# Patient Record
Sex: Female | Born: 1975 | Race: Black or African American | Hispanic: No | Marital: Single | Smoking: Former smoker
Health system: Southern US, Community
[De-identification: ages and names within clinical notes are randomized; demographics above are authoritative.]

## PROBLEM LIST (undated history)

## (undated) DIAGNOSIS — K219 Gastro-esophageal reflux disease without esophagitis: Secondary | ICD-10-CM

## (undated) DIAGNOSIS — I10 Essential (primary) hypertension: Secondary | ICD-10-CM

## (undated) DIAGNOSIS — E119 Type 2 diabetes mellitus without complications: Secondary | ICD-10-CM

## (undated) DIAGNOSIS — N838 Other noninflammatory disorders of ovary, fallopian tube and broad ligament: Secondary | ICD-10-CM

## (undated) DIAGNOSIS — K5790 Diverticulosis of intestine, part unspecified, without perforation or abscess without bleeding: Secondary | ICD-10-CM

## (undated) HISTORY — PX: CHOLECYSTECTOMY: SHX55

---

## 2006-01-12 ENCOUNTER — Emergency Department (HOSPITAL_COMMUNITY): Admission: EM | Admit: 2006-01-12 | Discharge: 2006-01-12 | Payer: Self-pay | Admitting: Emergency Medicine

## 2006-10-21 ENCOUNTER — Emergency Department (HOSPITAL_COMMUNITY): Admission: EM | Admit: 2006-10-21 | Discharge: 2006-10-21 | Payer: Self-pay | Admitting: Emergency Medicine

## 2008-03-22 ENCOUNTER — Emergency Department (HOSPITAL_COMMUNITY): Admission: EM | Admit: 2008-03-22 | Discharge: 2008-03-23 | Payer: Self-pay | Admitting: Emergency Medicine

## 2009-01-11 ENCOUNTER — Emergency Department (HOSPITAL_COMMUNITY): Admission: EM | Admit: 2009-01-11 | Discharge: 2009-01-11 | Payer: Self-pay | Admitting: Emergency Medicine

## 2009-07-02 ENCOUNTER — Emergency Department (HOSPITAL_COMMUNITY): Admission: EM | Admit: 2009-07-02 | Discharge: 2009-07-02 | Payer: Self-pay | Admitting: Family Medicine

## 2009-08-08 ENCOUNTER — Emergency Department (HOSPITAL_COMMUNITY): Admission: EM | Admit: 2009-08-08 | Discharge: 2009-08-08 | Payer: Self-pay | Admitting: Emergency Medicine

## 2010-01-07 ENCOUNTER — Observation Stay (HOSPITAL_COMMUNITY)
Admission: EM | Admit: 2010-01-07 | Discharge: 2010-01-09 | Payer: Self-pay | Source: Home / Self Care | Admitting: Emergency Medicine

## 2010-01-08 ENCOUNTER — Encounter (INDEPENDENT_AMBULATORY_CARE_PROVIDER_SITE_OTHER): Payer: Self-pay

## 2010-05-20 LAB — PREGNANCY, URINE: Preg Test, Ur: NEGATIVE

## 2010-05-20 LAB — CBC
HCT: 34.5 % — ABNORMAL LOW (ref 36.0–46.0)
MCH: 27.9 pg (ref 26.0–34.0)
MCV: 87.6 fL (ref 78.0–100.0)
Platelets: 283 10*3/uL (ref 150–400)
RBC: 3.52 MIL/uL — ABNORMAL LOW (ref 3.87–5.11)
RDW: 15 % (ref 11.5–15.5)
WBC: 13.1 10*3/uL — ABNORMAL HIGH (ref 4.0–10.5)
WBC: 18.5 10*3/uL — ABNORMAL HIGH (ref 4.0–10.5)

## 2010-05-20 LAB — URINALYSIS, ROUTINE W REFLEX MICROSCOPIC
Bilirubin Urine: NEGATIVE
Glucose, UA: NEGATIVE mg/dL
Ketones, ur: NEGATIVE mg/dL
Protein, ur: NEGATIVE mg/dL
Urobilinogen, UA: 0.2 mg/dL (ref 0.0–1.0)

## 2010-05-20 LAB — DIFFERENTIAL
Basophils Absolute: 0 10*3/uL (ref 0.0–0.1)
Basophils Relative: 0 % (ref 0–1)
Neutro Abs: 15.3 10*3/uL — ABNORMAL HIGH (ref 1.7–7.7)
Neutrophils Relative %: 83 % — ABNORMAL HIGH (ref 43–77)

## 2010-05-20 LAB — COMPREHENSIVE METABOLIC PANEL
AST: 34 U/L (ref 0–37)
Albumin: 2.9 g/dL — ABNORMAL LOW (ref 3.5–5.2)
Alkaline Phosphatase: 74 U/L (ref 39–117)
Alkaline Phosphatase: 81 U/L (ref 39–117)
BUN: 9 mg/dL (ref 6–23)
Chloride: 106 mEq/L (ref 96–112)
Creatinine, Ser: 0.87 mg/dL (ref 0.4–1.2)
GFR calc Af Amer: >60 mL/min (ref >60)
Glucose, Bld: 164 mg/dL — ABNORMAL HIGH (ref 70–99)
Potassium: 3.3 mEq/L — ABNORMAL LOW (ref 3.5–5.1)
Potassium: 3.6 mEq/L (ref 3.5–5.1)
Sodium: 139 mEq/L (ref 135–145)
Total Bilirubin: 0.7 mg/dL (ref 0.3–1.2)
Total Bilirubin: 0.8 mg/dL (ref 0.3–1.2)
Total Protein: 6.9 g/dL (ref 6.0–8.3)

## 2010-05-20 LAB — URINE CULTURE: Culture  Setup Time: 201111010520

## 2010-05-20 LAB — URINE MICROSCOPIC-ADD ON

## 2010-05-26 LAB — URINE MICROSCOPIC-ADD ON

## 2010-05-26 LAB — URINALYSIS, ROUTINE W REFLEX MICROSCOPIC
Bilirubin Urine: NEGATIVE
Glucose, UA: NEGATIVE mg/dL
Hgb urine dipstick: NEGATIVE
Specific Gravity, Urine: 1.03 (ref 1.005–1.030)

## 2010-05-26 LAB — CBC
Hemoglobin: 11.2 g/dL — ABNORMAL LOW (ref 12.0–15.0)
MCHC: 33.1 g/dL (ref 30.0–36.0)
MCV: 86.5 fL (ref 78.0–100.0)
RBC: 3.91 MIL/uL (ref 3.87–5.11)
WBC: 9.9 10*3/uL (ref 4.0–10.5)

## 2010-05-26 LAB — DIFFERENTIAL
Eosinophils Absolute: 0.2 10*3/uL (ref 0.0–0.7)
Lymphs Abs: 2.9 10*3/uL (ref 0.7–4.0)
Monocytes Absolute: 0.6 10*3/uL (ref 0.1–1.0)
Monocytes Relative: 6 % (ref 3–12)
Neutro Abs: 6 10*3/uL (ref 1.7–7.7)
Neutrophils Relative %: 61 % (ref 43–77)

## 2010-05-26 LAB — BASIC METABOLIC PANEL
BUN: 12 mg/dL (ref 6–23)
Creatinine, Ser: 0.94 mg/dL (ref 0.4–1.2)
Glucose, Bld: 165 mg/dL — ABNORMAL HIGH (ref 70–99)
Potassium: 3.4 mEq/L — ABNORMAL LOW (ref 3.5–5.1)

## 2010-05-26 LAB — POCT PREGNANCY, URINE: Preg Test, Ur: NEGATIVE

## 2010-05-26 LAB — WET PREP, GENITAL

## 2010-05-26 LAB — GC/CHLAMYDIA PROBE AMP, GENITAL: GC Probe Amp, Genital: NEGATIVE

## 2010-05-27 LAB — URINALYSIS, MICROSCOPIC ONLY
Glucose, UA: NEGATIVE mg/dL
Hgb urine dipstick: NEGATIVE
Specific Gravity, Urine: 1.029 (ref 1.005–1.030)

## 2010-05-27 LAB — URINE CULTURE: Colony Count: 60000

## 2010-05-27 LAB — COMPREHENSIVE METABOLIC PANEL
Albumin: 3.2 g/dL — ABNORMAL LOW (ref 3.5–5.2)
Alkaline Phosphatase: 78 U/L (ref 39–117)
BUN: 9 mg/dL (ref 6–23)
Calcium: 8.8 mg/dL (ref 8.4–10.5)
Glucose, Bld: 121 mg/dL — ABNORMAL HIGH (ref 70–99)
Potassium: 4 mEq/L (ref 3.5–5.1)
Sodium: 138 mEq/L (ref 135–145)
Total Protein: 6.8 g/dL (ref 6.0–8.3)

## 2010-05-27 LAB — CBC
HCT: 32.9 % — ABNORMAL LOW (ref 36.0–46.0)
Hemoglobin: 11.3 g/dL — ABNORMAL LOW (ref 12.0–15.0)
MCHC: 34.4 g/dL (ref 30.0–36.0)
Platelets: 283 10*3/uL (ref 150–400)
RDW: 15.1 % (ref 11.5–15.5)

## 2010-05-27 LAB — POCT URINALYSIS DIP (DEVICE)
Bilirubin Urine: NEGATIVE
Glucose, UA: NEGATIVE mg/dL
Ketones, ur: NEGATIVE mg/dL
Specific Gravity, Urine: >=1.03 (ref 1.005–1.030)
Urobilinogen, UA: 0.2 mg/dL (ref 0.0–1.0)

## 2010-05-27 LAB — POCT PREGNANCY, URINE: Preg Test, Ur: NEGATIVE

## 2010-06-23 LAB — URINE MICROSCOPIC-ADD ON

## 2010-06-23 LAB — URINALYSIS, ROUTINE W REFLEX MICROSCOPIC
Glucose, UA: NEGATIVE mg/dL
Nitrite: NEGATIVE
Specific Gravity, Urine: 1.026 (ref 1.005–1.030)
pH: 6 (ref 5.0–8.0)

## 2010-06-23 LAB — POCT PREGNANCY, URINE: Preg Test, Ur: NEGATIVE

## 2010-11-25 ENCOUNTER — Emergency Department (HOSPITAL_COMMUNITY)
Admission: EM | Admit: 2010-11-25 | Discharge: 2010-11-26 | Disposition: A | Discharge status: Home / Self Care | Payer: Medicaid Other | Attending: Emergency Medicine | Admitting: Emergency Medicine

## 2010-11-25 DIAGNOSIS — E669 Obesity, unspecified: Secondary | ICD-10-CM | POA: Insufficient documentation

## 2010-11-25 DIAGNOSIS — R109 Unspecified abdominal pain: Secondary | ICD-10-CM | POA: Insufficient documentation

## 2010-11-25 DIAGNOSIS — N83209 Unspecified ovarian cyst, unspecified side: Secondary | ICD-10-CM | POA: Insufficient documentation

## 2010-11-25 DIAGNOSIS — A5901 Trichomonal vulvovaginitis: Secondary | ICD-10-CM | POA: Insufficient documentation

## 2010-11-26 ENCOUNTER — Emergency Department (HOSPITAL_COMMUNITY): Payer: Medicaid Other

## 2010-11-26 LAB — URINE MICROSCOPIC-ADD ON

## 2010-11-26 LAB — URINALYSIS, ROUTINE W REFLEX MICROSCOPIC
Glucose, UA: NEGATIVE mg/dL
Nitrite: NEGATIVE
Specific Gravity, Urine: 1.035 — ABNORMAL HIGH (ref 1.005–1.030)
pH: 5 (ref 5.0–8.0)

## 2010-11-26 LAB — GC/CHLAMYDIA PROBE AMP, GENITAL: GC Probe Amp, Genital: NEGATIVE

## 2010-11-26 LAB — WET PREP, GENITAL

## 2010-11-26 LAB — PREGNANCY, URINE: Preg Test, Ur: NEGATIVE

## 2010-12-19 ENCOUNTER — Other Ambulatory Visit (HOSPITAL_COMMUNITY): Payer: Self-pay

## 2012-07-08 ENCOUNTER — Encounter (HOSPITAL_COMMUNITY): Payer: Self-pay | Admitting: *Deleted

## 2012-07-08 ENCOUNTER — Emergency Department (HOSPITAL_COMMUNITY)
Admission: EM | Admit: 2012-07-08 | Discharge: 2012-07-08 | Disposition: A | Discharge status: Home / Self Care | Payer: Medicaid Other | Attending: Emergency Medicine | Admitting: Emergency Medicine

## 2012-07-08 ENCOUNTER — Emergency Department (HOSPITAL_COMMUNITY): Payer: Medicaid Other

## 2012-07-08 DIAGNOSIS — M7989 Other specified soft tissue disorders: Secondary | ICD-10-CM

## 2012-07-08 DIAGNOSIS — M79609 Pain in unspecified limb: Secondary | ICD-10-CM

## 2012-07-08 DIAGNOSIS — M1710 Unilateral primary osteoarthritis, unspecified knee: Secondary | ICD-10-CM

## 2012-07-08 DIAGNOSIS — X500XXA Overexertion from strenuous movement or load, initial encounter: Secondary | ICD-10-CM | POA: Insufficient documentation

## 2012-07-08 DIAGNOSIS — S8990XA Unspecified injury of unspecified lower leg, initial encounter: Secondary | ICD-10-CM | POA: Insufficient documentation

## 2012-07-08 DIAGNOSIS — M25562 Pain in left knee: Secondary | ICD-10-CM

## 2012-07-08 DIAGNOSIS — Y929 Unspecified place or not applicable: Secondary | ICD-10-CM | POA: Insufficient documentation

## 2012-07-08 DIAGNOSIS — IMO0002 Reserved for concepts with insufficient information to code with codable children: Secondary | ICD-10-CM | POA: Insufficient documentation

## 2012-07-08 DIAGNOSIS — Y9301 Activity, walking, marching and hiking: Secondary | ICD-10-CM | POA: Insufficient documentation

## 2012-07-08 MED ORDER — OXYCODONE-ACETAMINOPHEN 5-325 MG PO TABS: 1.0000 | ORAL_TABLET | ORAL | Status: DC | PRN

## 2012-07-08 MED ORDER — ONDANSETRON 4 MG PO TBDP
4.0000 mg | ORAL_TABLET | Freq: Once | ORAL | Status: AC
  Administered 2012-07-08: 4 mg via ORAL
  Filled 2012-07-08: qty 1

## 2012-07-08 MED ORDER — OXYCODONE-ACETAMINOPHEN 5-325 MG PO TABS
2.0000 | ORAL_TABLET | Freq: Once | ORAL | Status: AC
  Administered 2012-07-08: 2 via ORAL
  Filled 2012-07-08: qty 2

## 2012-07-08 NOTE — Progress Notes (Signed)
Orthopedic Tech Progress Note Patient Details:  Erika Sherman 30-May-1975 [id number] Knee Immobilizer applied to Left LE. Patient is larger so knee immobilizer was just able to fit around upper thigh. Application tolerated well. Patient was asked if immobilizer was too tight or causing further pain and she stated it was not.  Ortho Devices Type of Ortho Device: Knee Immobilizer Ortho Device/Splint Interventions: Application   Asia R Thompson 07/08/2012, 12:42 PM

## 2012-07-08 NOTE — ED Notes (Signed)
Patient states leg injury some time ago  but yesterday she felt like knee cap shifted and now states knee with swelling and increase in pain

## 2012-07-08 NOTE — ED Provider Notes (Signed)
Medical screening examination/treatment/procedure(s) were performed by non-physician practitioner and as supervising physician I was immediately available for consultation/collaboration  Harrold Donath R. Rubin Payor, MD 07/08/12 [phone]

## 2012-07-08 NOTE — Progress Notes (Signed)
VASCULAR LAB PRELIMINARY  PRELIMINARY  PRELIMINARY  PRELIMINARY  Left lower extremity venous duplex completed.    Preliminary report:  Left:  No evidence of DVT, superficial thrombosis, or Baker's cyst.  Gizelle Whetsel, RVS 07/08/2012, 12:08 PM

## 2012-07-08 NOTE — ED Provider Notes (Signed)
History     CSN: [id number]  Arrival date & time 07/08/12  0746   First MD Initiated Contact with Patient 07/08/12 [phone]      Chief Complaint  Patient presents with  . Leg Pain    (Consider location/radiation/quality/duration/timing/severity/associated sxs/prior treatment) HPI  37 year old super morbidly obese female sent to the emergency department chief complaint of left knee pain.  Patient states that approximately 2 months ago she developed some aching pain in the knee that went away.  2 days ago the patient spent time walking a lot more than she normally does.  Patient states that she was laying in her bed and she moved "some kind of way" felt a pop in the anterior medial side of her knee and had immediate severe pain.  Patient states she is "barely able to walk."  Although she can weight bear weight toe touching.  Patient has pain at Ray at rest.  It is worse with palpation and movement.  She has had some swelling.  She denies a history of DVT or pulmonary embolism.  She denies she is on any exogenous estrogens.  She denies any heat in the leg.  Denies fevers, chills, myalgias, arthralgias. Denies DOE, SOB, chest tightness or pressure, radiation to left arm, jaw or back, or diaphoresis. Denies dysuria, flank pain, suprapubic pain, frequency, urgency, or hematuria. Denies headaches, light headedness, weakness, visual disturbances. Denies abdominal pain, nausea, vomiting, diarrhea or constipation.    History reviewed. No pertinent past medical history.  Past Surgical History  Procedure Laterality Date  . Cholecystectomy    . Cesarean section      No family history on file.  History  Substance Use Topics  . Smoking status: Not on file  . Smokeless tobacco: Not on file  . Alcohol Use: Not on file    OB History   Grav Para Term Preterm Abortions TAB SAB Ect Mult Living                  Review of Systems Ten systems reviewed and are negative for acute change, except as  noted in the HPI.   Allergies  Review of patient's allergies indicates no known allergies.  Home Medications   Current Outpatient Rx  Name  Route  Sig  Dispense  Refill  . acetaminophen (TYLENOL) 500 MG tablet   Oral   Take 500 mg by mouth every 6 (six) hours as needed for pain.           BP 152/94  Temp(Src) 97.5 F (36.4 C) (Oral)  Resp 20  SpO2 99%  LMP 06/14/2012  Physical Exam  Nursing note and vitals reviewed. Constitutional: She is oriented to person, place, and time. She appears well-developed and well-nourished.  Super morbidly obese female.  She appears to be in pain.  HENT:  Head: Normocephalic and atraumatic.  Eyes: Conjunctivae are normal. No scleral icterus.  Neck: Normal range of motion.  Cardiovascular: Normal rate, regular rhythm and normal heart sounds.  Exam reveals no gallop and no friction rub.   No murmur heard. Pulmonary/Chest: Effort normal and breath sounds normal. No respiratory distress.  Abdominal: Soft. Bowel sounds are normal. She exhibits no distension and no mass. There is no tenderness. There is no guarding.  Musculoskeletal:  A left knee exam was performed. SKIN: intact SWELLING: mild EFFUSION: none WARMTH: no warmth TENDERNESS: moderate ROM: limited by pain, and 90 degrees flexion180 degrees extension NEUROLOGICAL EXAM: normal VASCULAR EXAM: pulses present, dorsal pedalis pulse present,  tibialis posterior pulse present and no engorgement of the vessels or telangiectasias or CALF TENDERNESS: yes 1+ pitting edema in the left ankle.  No pitting edema in the right.   Neurological: She is alert and oriented to person, place, and time.  Skin: Skin is warm and dry.    ED Course  Procedures (including critical care time)  Labs Reviewed - No data to display No results found.   No diagnosis found.    MDM  9:18 AM Filed Vitals:   07/08/12 0756  BP: 152/94  Temp: 97.5 F (36.4 C)  Resp: 20   Super morbidly obese female  with left knee pain.  Mechanism of injury is uncertain.  Patient may have some internal derangement of the knee versus developing DVT.  Wells criteria for DVT is little risk it is very difficult to evaluate the patient due to her size.  She does have pitting edema in the left leg and calf tenderness who will evaluate for DVT.  X-ray of the knee is pending.  Patient has requested pain medication at this time appear   11:42 AM Xray shows advance OA. (viewed by myself). Patient is currently awaitni [place]. She is resting comfortable with her pain medication.   12:20 PM Patient with negative doppler. D/c with pain medication, knee immobilizer and f/u with ortho. Patient X-Ray negative for obvious fracture or dislocation. Pain managed in ED. Pt advised to follow up with orthopedics if symptoms persist for possibility of missed fracture diagnosis. Patient given brace while in ED, conservative therapy recommended and discussed. Patient will be dc home & is agreeable with above plan.   Arthor Captain, PA-C 07/08/12 1256

## 2013-02-20 ENCOUNTER — Institutional Professional Consult (permissible substitution): Payer: Medicaid Other | Admitting: Pulmonary Disease

## 2013-03-30 ENCOUNTER — Institutional Professional Consult (permissible substitution): Payer: Medicaid Other | Admitting: Pulmonary Disease

## 2013-04-28 ENCOUNTER — Institutional Professional Consult (permissible substitution): Payer: Medicaid Other | Admitting: Pulmonary Disease

## 2013-05-09 ENCOUNTER — Emergency Department (HOSPITAL_COMMUNITY)
Admission: EM | Admit: 2013-05-09 | Discharge: 2013-05-10 | Disposition: A | Discharge status: Home / Self Care | Payer: Medicaid Other | Attending: Emergency Medicine | Admitting: Emergency Medicine

## 2013-05-09 ENCOUNTER — Encounter (HOSPITAL_COMMUNITY): Payer: Self-pay | Admitting: Emergency Medicine

## 2013-05-09 ENCOUNTER — Emergency Department (HOSPITAL_COMMUNITY)
Admission: EM | Admit: 2013-05-09 | Discharge: 2013-05-09 | Disposition: A | Discharge status: Home / Self Care | Payer: Medicaid Other | Source: Home / Self Care | Attending: Emergency Medicine | Admitting: Emergency Medicine

## 2013-05-09 ENCOUNTER — Emergency Department (HOSPITAL_COMMUNITY): Payer: Medicaid Other

## 2013-05-09 DIAGNOSIS — R0789 Other chest pain: Secondary | ICD-10-CM

## 2013-05-09 DIAGNOSIS — M549 Dorsalgia, unspecified: Secondary | ICD-10-CM | POA: Insufficient documentation

## 2013-05-09 DIAGNOSIS — J45901 Unspecified asthma with (acute) exacerbation: Secondary | ICD-10-CM | POA: Insufficient documentation

## 2013-05-09 DIAGNOSIS — J690 Pneumonitis due to inhalation of food and vomit: Secondary | ICD-10-CM

## 2013-05-09 DIAGNOSIS — R071 Chest pain on breathing: Secondary | ICD-10-CM | POA: Insufficient documentation

## 2013-05-09 DIAGNOSIS — F17200 Nicotine dependence, unspecified, uncomplicated: Secondary | ICD-10-CM | POA: Insufficient documentation

## 2013-05-09 DIAGNOSIS — Z8719 Personal history of other diseases of the digestive system: Secondary | ICD-10-CM | POA: Insufficient documentation

## 2013-05-09 DIAGNOSIS — Z79899 Other long term (current) drug therapy: Secondary | ICD-10-CM | POA: Insufficient documentation

## 2013-05-09 DIAGNOSIS — R0602 Shortness of breath: Secondary | ICD-10-CM

## 2013-05-09 DIAGNOSIS — J45909 Unspecified asthma, uncomplicated: Secondary | ICD-10-CM

## 2013-05-09 HISTORY — DX: Gastro-esophageal reflux disease without esophagitis: K21.9

## 2013-05-09 LAB — CBC WITH DIFFERENTIAL/PLATELET
BASOS ABS: 0 10*3/uL (ref 0.0–0.1)
BASOS PCT: 0 % (ref 0–1)
EOS ABS: 0.3 10*3/uL (ref 0.0–0.7)
EOS PCT: 3 % (ref 0–5)
HEMATOCRIT: 36.1 % (ref 36.0–46.0)
Hemoglobin: 11.5 g/dL — ABNORMAL LOW (ref 12.0–15.0)
Lymphocytes Relative: 35 % (ref 12–46)
Lymphs Abs: 3.8 10*3/uL (ref 0.7–4.0)
MCH: 28.4 pg (ref 26.0–34.0)
MCHC: 31.9 g/dL (ref 30.0–36.0)
MCV: 89.1 fL (ref 78.0–100.0)
MONO ABS: 0.8 10*3/uL (ref 0.1–1.0)
MONOS PCT: 7 % (ref 3–12)
NEUTROS ABS: 6 10*3/uL (ref 1.7–7.7)
Neutrophils Relative %: 55 % (ref 43–77)
Platelets: 330 10*3/uL (ref 150–400)
RBC: 4.05 MIL/uL (ref 3.87–5.11)
RDW: 14.6 % (ref 11.5–15.5)
WBC: 10.9 10*3/uL — ABNORMAL HIGH (ref 4.0–10.5)

## 2013-05-09 LAB — I-STAT TROPONIN, ED: TROPONIN I, POC: 0 ng/mL (ref 0.00–0.08)

## 2013-05-09 NOTE — ED Notes (Signed)
Pt states yesterday she had a flare up of reflux and almost vomited but held it in.  Pt feels like she may have aspirated.  C/o pain in lungs, sob, and wheezing.   Pt has tried inhaler and one pain pill with no relief.

## 2013-05-09 NOTE — ED Provider Notes (Signed)
  Chief Complaint   Chief Complaint  Patient presents with  . Aspiration    History of Present Illness   Erika Sherman is a 38 year old morbidly obese female. She's had difficulty breathing since last night with coughing, wheezing, and chest pain the left side. She had an attack of acid reflux around 2 AM. She woke up choking and gasping. The breathing difficulties gone ever since then. She also has abdominal pain. She denies any fever, dizziness, or syncope. She's had no nausea or vomiting. The patient is on no medications right now. She's not taking anything for reflux.  Review of Systems   Other than as noted above, the patient denies any of the following symptoms. Systemic:  No fever, chills, sweats, or fatigue. ENT:  No nasal congestion, rhinorrhea, or sore throat. Pulmonary:  No cough, wheezing, sputum production, hemoptysis. Cardiac:  No chest pain, palpitations, rapid heartbeat, dizziness, presyncope or syncope. Skin:  No rash or itching. Ext:  No leg pain or swelling. Psych:  No anxiety or depression.  PMFSH   Past medical history, family history, social history, meds, and allergies were reviewed and updated as needed. The patient denies any history of asthma, lung disease, heart disease, anxiety, or tobacco use.   Physical Examination    Vital signs:  LMP 04/15/2013 Gen:  Alert, oriented, in no distress, skin warm and dry. She is morbidly obese. Eye:  PERRL, lids and conjunctivas normal.  Sclera non-icteric. ENT:  Mucous membranes moist, pharynx clear. Neck:  Supple, no adenopathy or tenderness.  No JVD. Lungs:  She has diffuse expiratory wheezes and rhonchi. Heart:  Regular rhythm.  No gallops, murmers, clicks or rubs. Abdomen:  Soft, nontender, no organomegaly or mass.  Bowel sounds normal.  No pulsatile abdominal mass or bruit. Ext:  No edema.  No calf tenderness and Homann's sign negative.  Pulses full and equal. Skin:  Warm and dry.  No rash.  Assessment    The encounter diagnosis was Aspiration pneumonia.  I am concerned about the possibility of aspiration pneumonia. The patient is morbidly obese, and I'm unable to do an x-ray which equipment we have here.  Plan    The patient was transferred to the ED via private vehicle in stable condition.  Medical Decision Making   38 year old morbidly obese female had and episode of severe reflux last night at 2 a.m.  Ever since then she's had shortness of breath, cough and wheezing.  On exam she has bilateral wheezes.  I am concerned about aspiration.  We cannot do a chest x-ray here due to her size.  We are transferring by private vehicle.        Reuben Likesavid C Tivis Wherry, MD 05/09/13 2105

## 2013-05-09 NOTE — ED Notes (Signed)
Pt states she has been having some SOB since yesterday.  Pt states she had a gerd attach and is worried some fluid may have gone into her lungs.

## 2013-05-09 NOTE — ED Provider Notes (Signed)
CSN: [id number]     Arrival date & time 05/09/13  2146 History  This chart was scribed for non-physician practitioner Lowella DellGray Arling Cerone, PA-C working with Layla MawKristen N Ward, DO by Danella Maiersaroline Early, ED Scribe. This patient was seen in room TR08C/TR08C and the patient's care was started at 10:00 PM.    Chief Complaint  Patient presents with  . Shortness of Breath  . Gastrophageal Reflux   The history is provided by the patient. No language interpreter was used.   HPI Comments: Erika Sherman is a 38 y.o. female with a h/o asthma who presents to the Emergency Department complaining of SOB since last night with wheezing and one episode of vomiting. She states it gets worse when she lays flat. She was sent here from Urgent Care. Her CXR was normal. She has used her inhaler twice today. The inhaler is as needed. She denies diarrhea constipation fever chills leg swelling cough congestion. She is a smoker - she started smoking 5 cigarettes per day last year.  She also reports reflux since last night. She reports non-radiating, constant, dull CP.The CP worsens when she presses on it. She is unable to determine if the CP is related to the SOB or the reflux. She denies BC use. She denies h/o DVT or PE. She denies recent surgery or trauma.   Age > 36 yo: No HR > 100 bpm: No O2 sat on RA < 95%: No Prior hx of venous thromboembolism:No Trauma or surgery in past 4 wks:No Hemoptysis:No Exogenous Estrogen use:No Unilateral Leg swelling: No Pre tests probability for PE < 15%:No       Past Medical History  Diagnosis Date  . GERD (gastroesophageal reflux disease)    Past Surgical History  Procedure Laterality Date  . Cholecystectomy    . Cesarean section     No family history on file. History  Substance Use Topics  . Smoking status: Heavy Tobacco Smoker -- 0.50 packs/day    Types: Cigarettes  . Smokeless tobacco: Not on file  . Alcohol Use: Yes   OB History   Grav Para Term Preterm Abortions TAB SAB  Ect Mult Living                 Review of Systems  Musculoskeletal: Positive for back pain.  All other systems reviewed and are negative.      Allergies  Review of patient's allergies indicates no known allergies.  Home Medications   Current Outpatient Rx  Name  Route  Sig  Dispense  Refill  . acetaminophen (TYLENOL) 500 MG tablet   Oral   Take 500 mg by mouth every 6 (six) hours as needed for mild pain.          Marland Kitchen. albuterol (PROVENTIL HFA;VENTOLIN HFA) 108 (90 BASE) MCG/ACT inhaler   Inhalation   Inhale 2 puffs into the lungs every 2 (two) hours as needed for wheezing or shortness of breath (cough).   1 Inhaler   0   . naproxen (NAPROSYN) 500 MG tablet   Oral   Take 1 tablet (500 mg total) by mouth 2 (two) times daily.   30 tablet   0    BP 185/93  Pulse 103  Temp(Src) 98.1 F (36.7 C) (Oral)  Resp 18  SpO2 98%  LMP 04/15/2013 Physical Exam  Nursing note and vitals reviewed. Constitutional: She is oriented to person, place, and time. She appears well-developed and well-nourished. No distress.  HENT:  Head: Normocephalic and atraumatic.  Eyes: Conjunctivae  are normal.  Neck: No tracheal deviation present.  Cardiovascular: Normal rate and regular rhythm.  Exam reveals no gallop and no friction rub.   No murmur heard. Pulmonary/Chest: Effort normal. No respiratory distress. She has wheezes (diffuse).  Musculoskeletal: Normal range of motion. She exhibits no edema.  Neurological: She is alert and oriented to person, place, and time.  Skin: Skin is warm and dry. She is not diaphoretic.  Psychiatric: She has a normal mood and affect. Her behavior is normal.    ED Course  Procedures (including critical care time) Medications  ipratropium-albuterol (DUONEB) 0.5-2.5 (3) MG/3ML nebulizer solution 3 mL (3 mLs Nebulization Given 05/10/13 0036)    DIAGNOSTIC STUDIES: Oxygen Saturation is 98% on RA, normal by my interpretation.    COORDINATION OF CARE: 11:10  PM- Discussed treatment plan with pt. Pt agrees to plan.    Labs Review Labs Reviewed  BASIC METABOLIC PANEL - Abnormal; Notable for the following:    Glucose, Bld 132 (*)    All other components within normal limits  CBC WITH DIFFERENTIAL - Abnormal; Notable for the following:    WBC 10.9 (*)    Hemoglobin 11.5 (*)    All other components within normal limits  PRO B NATRIURETIC PEPTIDE  I-STAT TROPOININ, ED   Imaging Review Dg Chest 2 View  05/09/2013   CLINICAL DATA:  Shortness of breath.  Gastroesophageal reflux  EXAM: CHEST  2 VIEW  COMPARISON:  01/07/2010  FINDINGS: The interstitial markings appear prominent, but stable from prior. Borderline cardiomegaly. No edema or consolidation. No effusion or pneumothorax.  IMPRESSION: Stable exam.  No acute cardiopulmonary disease.   Electronically Signed   By: Tiburcio Pea M.D.   On: 05/09/2013 22:29     EKG Interpretation None      MDM   Final diagnoses:  Chest wall pain  Shortness of breath  Asthma  Patient afebrile.  Mild tachycardia at 103 on admission, resolved throughout stay.  Leukocytosis trending down from prior studies.  Patient PERC negative BMP WNL Troponin neg, Doubt MI BNP negative, doubt CHF CXR negative, with no evidence of widened mediastinum. Doubt Dissection.  Patient states her breathing improved after breathing tx in ED. Wheezing resolved with breathing tx.  Patient's chest pain reproducible on exam with compression of left chest wall. Suggestive of chest wall pain. Plan to treat patient with NSAIDs an have her follow up with her PCP in 2 days. Discussed results and exam findings with patient. Patient confirms understanding and agrees with plan. Discharged in good condition.   Meds given in ED:  Medications  ipratropium-albuterol (DUONEB) 0.5-2.5 (3) MG/3ML nebulizer solution 3 mL (3 mLs Nebulization Given 05/10/13 0036)    Discharge Medication List as of 05/10/2013  1:30 AM    START taking these  medications   Details  albuterol (PROVENTIL HFA;VENTOLIN HFA) 108 (90 BASE) MCG/ACT inhaler Inhale 2 puffs into the lungs every 2 (two) hours as needed for wheezing or shortness of breath (cough)., Starting 05/10/2013, Until Discontinued, Print    naproxen (NAPROSYN) 500 MG tablet Take 1 tablet (500 mg total) by mouth 2 (two) times daily., Starting 05/10/2013, Until Discontinued, Print           I personally performed the services described in this documentation, which was scribed in my presence. The recorded information has been reviewed and is accurate.   Rudene Anda, PA-C 05/10/13 1344

## 2013-05-09 NOTE — Discharge Instructions (Signed)
We have determined that your problem requires further evaluation in the emergency department.  We will take care of your transport there.  Once at the emergency department, you will be evaluated by a provider and they will order whatever treatment or tests they deem necessary.  We cannot guarantee that they will do any specific test or do any specific treatment.  ° °

## 2013-05-10 LAB — BASIC METABOLIC PANEL
BUN: 11 mg/dL (ref 6–23)
CHLORIDE: 105 meq/L (ref 96–112)
CO2: 25 mEq/L (ref 19–32)
CREATININE: 0.75 mg/dL (ref 0.50–1.10)
Calcium: 9.4 mg/dL (ref 8.4–10.5)
GFR calc non Af Amer: >90 mL/min (ref >90)
GLUCOSE: 132 mg/dL — AB (ref 70–99)
Potassium: 3.9 mEq/L (ref 3.7–5.3)
Sodium: 143 mEq/L (ref 137–147)

## 2013-05-10 LAB — PRO B NATRIURETIC PEPTIDE: Pro B Natriuretic peptide (BNP): <5 pg/mL (ref 0–125)

## 2013-05-10 MED ORDER — IPRATROPIUM-ALBUTEROL 0.5-2.5 (3) MG/3ML IN SOLN
3.0000 mL | Freq: Once | RESPIRATORY_TRACT | Status: AC
  Administered 2013-05-10: 3 mL via RESPIRATORY_TRACT
  Filled 2013-05-10: qty 3

## 2013-05-10 MED ORDER — NAPROXEN 500 MG PO TABS
500.0000 mg | ORAL_TABLET | Freq: Two times a day (BID) | ORAL | Status: DC
Start: 2013-05-10 — End: 2013-12-28

## 2013-05-10 MED ORDER — ALBUTEROL SULFATE HFA 108 (90 BASE) MCG/ACT IN AERS: 2.0000 | INHALATION_SPRAY | RESPIRATORY_TRACT | Status: AC | PRN

## 2013-05-10 NOTE — Discharge Instructions (Signed)
Follow up with your doctor in 2 days. Take medications as directed. Return to ED if you develop any worsening chest pain or shortness of breath.    Chest Pain (Nonspecific) It is often hard to give a specific diagnosis for the cause of chest pain. There is always a chance that your pain could be related to something serious, such as a heart attack or a blood clot in the lungs. You need to follow up with your caregiver for further evaluation. CAUSES   Heartburn.  Pneumonia or bronchitis.  Anxiety or stress.  Inflammation around your heart (pericarditis) or lung (pleuritis or pleurisy).  A blood clot in the lung.  A collapsed lung (pneumothorax). It can develop suddenly on its own (spontaneous pneumothorax) or from injury (trauma) to the chest.  Shingles infection (herpes zoster virus). The chest wall is composed of bones, muscles, and cartilage. Any of these can be the source of the pain.  The bones can be bruised by injury.  The muscles or cartilage can be strained by coughing or overwork.  The cartilage can be affected by inflammation and become sore (costochondritis). DIAGNOSIS  Lab tests or other studies, such as X-rays, electrocardiography, stress testing, or cardiac imaging, may be needed to find the cause of your pain.  TREATMENT   Treatment depends on what may be causing your chest pain. Treatment may include:  Acid blockers for heartburn.  Anti-inflammatory medicine.  Pain medicine for inflammatory conditions.  Antibiotics if an infection is present.  You may be advised to change lifestyle habits. This includes stopping smoking and avoiding alcohol, caffeine, and chocolate.  You may be advised to keep your head raised (elevated) when sleeping. This reduces the chance of acid going backward from your stomach into your esophagus.  Most of the time, nonspecific chest pain will improve within 2 to 3 days with rest and mild pain medicine. HOME CARE INSTRUCTIONS   If  antibiotics were prescribed, take your antibiotics as directed. Finish them even if you start to feel better.  For the next few days, avoid physical activities that bring on chest pain. Continue physical activities as directed.  Do not smoke.  Avoid drinking alcohol.  Only take over-the-counter or prescription medicine for pain, discomfort, or fever as directed by your caregiver.  Follow your caregiver's suggestions for further testing if your chest pain does not go away.  Keep any follow-up appointments you made. If you do not go to an appointment, you could develop lasting (chronic) problems with pain. If there is any problem keeping an appointment, you must call to reschedule. SEEK MEDICAL CARE IF:   You think you are having problems from the medicine you are taking. Read your medicine instructions carefully.  Your chest pain does not go away, even after treatment.  You develop a rash with blisters on your chest. SEEK IMMEDIATE MEDICAL CARE IF:   You have increased chest pain or pain that spreads to your arm, neck, jaw, back, or abdomen.  You develop shortness of breath, an increasing cough, or you are coughing up blood.  You have severe back or abdominal pain, feel nauseous, or vomit.  You develop severe weakness, fainting, or chills.  You have a fever. THIS IS AN EMERGENCY. Do not wait to see if the pain will go away. Get medical help at once. Call your local emergency services (911 in [place] Do not drive yourself to the hospital. MAKE SURE YOU:   Understand these instructions.  Will watch your condition.  Will get help right away if you are not doing well or get worse. Document Released: 12/03/2004 Document Revised: 05/18/2011 Document Reviewed: 09/29/2007 [facility] Patient Information 2014 [place], [place].

## 2013-05-11 NOTE — ED Provider Notes (Signed)
Medical screening examination/treatment/procedure(s) were performed by non-physician practitioner and as supervising physician I was immediately available for consultation/collaboration.   EKG Interpretation   Date/Time:  Tuesday May 09 2013 23:32:23 EST Ventricular Rate:  93 PR Interval:  154 QRS Duration: 90 QT Interval:  382 QTC Calculation: 474 R Axis:   110 Text Interpretation:  Normal sinus rhythm Right axis deviation T wave  abnormality, consider inferior ischemia Abnormal ECG ED PHYSICIAN  INTERPRETATION AVAILABLE IN [facility] Confirmed by TEST, Record  ([id number]) on 05/11/2013 7:32:10 AM        Layla MawKristen N Deondre Marinaro, DO 05/11/13 2342

## 2013-06-09 DIAGNOSIS — N946 Dysmenorrhea, unspecified: Secondary | ICD-10-CM | POA: Insufficient documentation

## 2013-06-22 ENCOUNTER — Institutional Professional Consult (permissible substitution): Payer: Medicaid Other | Admitting: Pulmonary Disease

## 2013-06-23 ENCOUNTER — Institutional Professional Consult (permissible substitution): Payer: Medicaid Other | Admitting: Pulmonary Disease

## 2013-08-22 ENCOUNTER — Institutional Professional Consult (permissible substitution): Payer: Medicaid Other | Admitting: Pulmonary Disease

## 2013-09-15 ENCOUNTER — Institutional Professional Consult (permissible substitution): Payer: Medicaid Other | Admitting: Pulmonary Disease

## 2013-11-02 ENCOUNTER — Institutional Professional Consult (permissible substitution): Payer: Medicaid Other | Admitting: Pulmonary Disease

## 2013-12-28 ENCOUNTER — Emergency Department (HOSPITAL_COMMUNITY)
Admission: EM | Admit: 2013-12-28 | Discharge: 2013-12-28 | Disposition: A | Discharge status: Home / Self Care | Payer: Medicaid Other | Attending: Emergency Medicine | Admitting: Emergency Medicine

## 2013-12-28 ENCOUNTER — Encounter (HOSPITAL_COMMUNITY): Payer: Self-pay | Admitting: Emergency Medicine

## 2013-12-28 DIAGNOSIS — Z79899 Other long term (current) drug therapy: Secondary | ICD-10-CM | POA: Insufficient documentation

## 2013-12-28 DIAGNOSIS — B349 Viral infection, unspecified: Secondary | ICD-10-CM | POA: Insufficient documentation

## 2013-12-28 DIAGNOSIS — Z720 Tobacco use: Secondary | ICD-10-CM | POA: Insufficient documentation

## 2013-12-28 DIAGNOSIS — J029 Acute pharyngitis, unspecified: Secondary | ICD-10-CM

## 2013-12-28 DIAGNOSIS — Z8719 Personal history of other diseases of the digestive system: Secondary | ICD-10-CM | POA: Insufficient documentation

## 2013-12-28 DIAGNOSIS — Z792 Long term (current) use of antibiotics: Secondary | ICD-10-CM | POA: Insufficient documentation

## 2013-12-28 DIAGNOSIS — H9209 Otalgia, unspecified ear: Secondary | ICD-10-CM | POA: Insufficient documentation

## 2013-12-28 DIAGNOSIS — R51 Headache: Secondary | ICD-10-CM | POA: Insufficient documentation

## 2013-12-28 LAB — RAPID STREP SCREEN (MED CTR MEBANE ONLY): Streptococcus, Group A Screen (Direct): NEGATIVE

## 2013-12-28 MED ORDER — TRAMADOL HCL 50 MG PO TABS: 50.0000 mg | ORAL_TABLET | Freq: Four times a day (QID) | ORAL | Status: DC | PRN

## 2013-12-28 MED ORDER — DEXAMETHASONE SODIUM PHOSPHATE 10 MG/ML IJ SOLN
10.0000 mg | Freq: Once | INTRAMUSCULAR | Status: AC
  Administered 2013-12-28: 10 mg via INTRAMUSCULAR

## 2013-12-28 MED ORDER — IBUPROFEN 600 MG PO TABS: 600.0000 mg | ORAL_TABLET | Freq: Four times a day (QID) | ORAL | Status: DC | PRN

## 2013-12-28 MED ORDER — DEXAMETHASONE SODIUM PHOSPHATE 10 MG/ML IJ SOLN
10.0000 mg | Freq: Once | INTRAMUSCULAR | Status: DC
  Filled 2013-12-28: qty 1

## 2013-12-28 MED ORDER — AMOXICILLIN 500 MG PO CAPS: 500.0000 mg | ORAL_CAPSULE | Freq: Three times a day (TID) | ORAL | Status: DC

## 2013-12-28 MED ORDER — HYDROCODONE-ACETAMINOPHEN 5-325 MG PO TABS
2.0000 | ORAL_TABLET | Freq: Once | ORAL | Status: AC
  Administered 2013-12-28: 2 via ORAL
  Filled 2013-12-28: qty 2

## 2013-12-28 MED ORDER — METOCLOPRAMIDE HCL 10 MG PO TABS
10.0000 mg | ORAL_TABLET | Freq: Once | ORAL | Status: AC
Start: 2013-12-28 — End: 2013-12-28
  Administered 2013-12-28: 10 mg via ORAL
  Filled 2013-12-28: qty 1

## 2013-12-28 MED ORDER — IBUPROFEN 200 MG PO TABS
600.0000 mg | ORAL_TABLET | Freq: Once | ORAL | Status: AC
  Administered 2013-12-28: 600 mg via ORAL
  Filled 2013-12-28: qty 3

## 2013-12-28 NOTE — ED Notes (Signed)
Patient given gown-and urine cup. Patient states she will change after she uses restroom.

## 2013-12-28 NOTE — Progress Notes (Signed)
[facility] Stacy,  Provided pt with a list of primary care resources and a [facility] orange card application to help patient establish primary care.

## 2013-12-28 NOTE — ED Provider Notes (Signed)
CSN: [id number]     Arrival date & time 12/28/13  [id number] History   First MD Initiated Contact with Patient 12/28/13 [phone]     Chief Complaint  Patient presents with  . Sore Throat  . Headache  . Otalgia  . Cough     (Consider location/radiation/quality/duration/timing/severity/associated sxs/prior Treatment) HPI Comments: SUBJECTIVE:  Erika Sherman is a 38 y.o. female who complains of sore throat, night sweats, dry cough, myalgias, headache, chills, bilateral ear pain while swallowing for 3 days. She denies a history of nausea, vomiting and wheezing and has a history of asthma. Patient denies smoke cigarettes.  Pt also has a headache, L sided. Headache is constant, throbbing. Pt heart a pop 3 days ago, in her head, when the pain started. Pain is constant. No associated nausea, vomiting, visual complains, seizures, altered mental status, loss of consciousness, new focal weakness, or numbness, no gait instability. Pt has no fam hx of brain CA, AN, bleeds. She reports pain is worse when laying flat.  Patient is a 38 y.o. female presenting with pharyngitis, headaches, ear pain, and cough. The history is provided by the patient.  Sore Throat Associated symptoms include headaches. Pertinent negatives include no shortness of breath.  Headache Associated symptoms: cough, ear pain and myalgias   Associated symptoms: no fever, no hearing loss, no neck pain, no neck stiffness and no sinus pressure   Otalgia Associated symptoms: cough and headaches   Associated symptoms: no fever, no hearing loss, no neck pain and no rash   Cough Associated symptoms: ear pain, headaches and myalgias   Associated symptoms: no fever, no rash, no shortness of breath and no wheezing     Past Medical History  Diagnosis Date  . GERD (gastroesophageal reflux disease)    Past Surgical History  Procedure Laterality Date  . Cholecystectomy    . Cesarean section     No family history on file. History  Substance  Use Topics  . Smoking status: Heavy Tobacco Smoker -- 0.50 packs/day    Types: Cigarettes  . Smokeless tobacco: Not on file  . Alcohol Use: Yes   OB History   Grav Para Term Preterm Abortions TAB SAB Ect Mult Living                 Review of Systems  Constitutional: Positive for activity change. Negative for fever.  HENT: Positive for ear pain. Negative for facial swelling, hearing loss and sinus pressure.   Eyes: Negative for visual disturbance.  Respiratory: Positive for cough. Negative for shortness of breath and wheezing.   Musculoskeletal: Positive for myalgias. Negative for neck pain and neck stiffness.  Skin: Negative for rash.  Neurological: Positive for headaches.      Allergies  Review of patient's allergies indicates no known allergies.  Home Medications   Prior to Admission medications   Medication Sig Start Date End Date Taking? Authorizing Provider  albuterol (PROVENTIL HFA;VENTOLIN HFA) 108 (90 BASE) MCG/ACT inhaler Inhale 2 puffs into the lungs every 2 (two) hours as needed for wheezing or shortness of breath (cough). 05/10/13  Yes Rudene AndaJacob Gray Lackey, PA-C  ibuprofen (ADVIL,MOTRIN) 200 MG tablet Take 600-800 mg by mouth every 6 (six) hours as needed for moderate pain.   Yes Historical Provider, MD  amoxicillin (AMOXIL) 500 MG capsule Take 1 capsule (500 mg total) by mouth 3 (three) times daily. 12/28/13   Derwood KaplanAnkit Zyonna Vardaman, MD  ibuprofen (ADVIL,MOTRIN) 600 MG tablet Take 1 tablet (600 mg total) by mouth every 6 (  six) hours as needed. 12/28/13   Derwood KaplanAnkit Maalik Pinn, MD  traMADol (ULTRAM) 50 MG tablet Take 1 tablet (50 mg total) by mouth every 6 (six) hours as needed. 12/28/13   Samay Delcarlo, MD   BP 172/101  Pulse 99  Temp(Src) 97.7 F (36.5 C) (Oral)  Resp 18  SpO2 100% Physical Exam  Nursing note and vitals reviewed. Constitutional: She is oriented to person, place, and time. She appears well-developed and well-nourished.  HENT:  Head: Normocephalic and  atraumatic.  Right Ear: External ear normal.  Left Ear: External ear normal.  Mouth/Throat: Oropharyngeal exudate present.  Pt has left sided tonsillar exudate. No trismus. No crepitus. No anteror cervical lymphadenopathy  Eyes: EOM are normal. Pupils are equal, round, and reactive to light.  Neck: Neck supple. No JVD present.  Cardiovascular: Normal rate, regular rhythm and normal heart sounds.   Pulmonary/Chest: Effort normal. No respiratory distress.  Abdominal: Soft. She exhibits no distension. There is no tenderness. There is no rebound and no guarding.  Lymphadenopathy:    She has no cervical adenopathy.  Neurological: She is alert and oriented to person, place, and time. No cranial nerve deficit. Coordination normal.  Skin: Skin is warm and dry.    ED Course  Procedures (including critical care time) Labs Review Labs Reviewed  RAPID STREP SCREEN  CULTURE, GROUP A STREP    Imaging Review No results found.   EKG Interpretation None      MDM   Final diagnoses:  Viral syndrome  Pharyngitis    DDX includes: Viral syndrome Influenza Pharyngitis Sinusitis  DDX includes: Primary Tumor Sinusitis Vascular headaches AV malformation Brain aneurysm Psuedotumor cerebri  A/P: Pt comes in with cc of headaches and uri like sx. CENTOR score low - has exudates and pain. Rapid screen is ne for strep. Amoxi - wait and see approach.  Headache - no emergent condition. Sx does concerns for psuedotumor cerebri, and pt is morbidly obese. No visual complains. Neuro f/u if headache not better with analgesics.    Derwood KaplanAnkit Finnleigh Marchetti, MD 12/28/13 [phone]

## 2013-12-28 NOTE — Discharge Instructions (Signed)
We saw you in the ER for the headache, cough. We think what you have is a viral syndrome - the treatment for which is symptomatic relief only, and your body will fight the infection off in a few days. We are prescribing you some meds for pain and fevers.  TAKE ANTIBIOTICS IF ONLY NOT IMPROVING IN 2-3 DAYS.  We are not sure what is causing your headaches, however, there appears to be no evidence of infection, bleeds or tumors based on our exam and results.  Please take motrin round the clock for the next 6 hours, and take other meds prescribed only for break through pain. See the Neurologist if the pain persists.   General Headache Without Cause A headache is pain or discomfort felt around the head or neck area. The specific cause of a headache may not be found. There are many causes and types of headaches. A few common ones are:  Tension headaches.  Migraine headaches.  Cluster headaches.  Chronic daily headaches. HOME CARE INSTRUCTIONS   Keep all follow-up appointments with your caregiver or any specialist referral.  Only take over-the-counter or prescription medicines for pain or discomfort as directed by your caregiver.  Lie down in a dark, quiet room when you have a headache.  Keep a headache journal to find out what may trigger your migraine headaches. For example, write down:  What you eat and drink.  How much sleep you get.  Any change to your diet or medicines.  Try massage or other relaxation techniques.  Put ice packs or heat on the head and neck. Use these 3 to 4 times per day for 15 to 20 minutes each time, or as needed.  Limit stress.  Sit up straight, and do not tense your muscles.  Quit smoking if you smoke.  Limit alcohol use.  Decrease the amount of caffeine you drink, or stop drinking caffeine.  Eat and sleep on a regular schedule.  Get 7 to 9 hours of sleep, or as recommended by your caregiver.  Keep lights dim if bright lights bother you and  make your headaches worse. SEEK MEDICAL CARE IF:   You have problems with the medicines you were prescribed.  Your medicines are not working.  You have a change from the usual headache.  You have nausea or vomiting. SEEK IMMEDIATE MEDICAL CARE IF:   Your headache becomes severe.  You have a fever.  You have a stiff neck.  You have loss of vision.  You have muscular weakness or loss of muscle control.  You start losing your balance or have trouble walking.  You feel faint or pass out.  You have severe symptoms that are different from your first symptoms. MAKE SURE YOU:   Understand these instructions.  Will watch your condition.  Will get help right away if you are not doing well or get worse. Document Released: 02/23/2005 Document Revised: 05/18/2011 Document Reviewed: 03/11/2011 [facility] Patient Information 2015 [place]. This information is not intended to replace advice given to you by your health care provider. Make sure you discuss any questions you have with your health care provider.

## 2013-12-28 NOTE — ED Notes (Signed)
Pt c/o sore throat, headache, otalgia, and cough x 3 days.

## 2013-12-29 LAB — CULTURE, GROUP A STREP

## 2014-04-04 ENCOUNTER — Emergency Department (HOSPITAL_COMMUNITY): Payer: Medicaid Other

## 2014-04-04 ENCOUNTER — Emergency Department (HOSPITAL_COMMUNITY)
Admission: EM | Admit: 2014-04-04 | Discharge: 2014-04-04 | Disposition: A | Discharge status: Home / Self Care | Payer: Medicaid Other | Attending: Emergency Medicine | Admitting: Emergency Medicine

## 2014-04-04 ENCOUNTER — Encounter (HOSPITAL_COMMUNITY): Payer: Self-pay

## 2014-04-04 DIAGNOSIS — Z720 Tobacco use: Secondary | ICD-10-CM | POA: Insufficient documentation

## 2014-04-04 DIAGNOSIS — Z8719 Personal history of other diseases of the digestive system: Secondary | ICD-10-CM | POA: Insufficient documentation

## 2014-04-04 DIAGNOSIS — Z79899 Other long term (current) drug therapy: Secondary | ICD-10-CM | POA: Insufficient documentation

## 2014-04-04 DIAGNOSIS — R0789 Other chest pain: Secondary | ICD-10-CM

## 2014-04-04 DIAGNOSIS — R0602 Shortness of breath: Secondary | ICD-10-CM | POA: Insufficient documentation

## 2014-04-04 LAB — CBC
HEMATOCRIT: 39.5 % (ref 36.0–46.0)
Hemoglobin: 12.3 g/dL (ref 12.0–15.0)
MCH: 27.6 pg (ref 26.0–34.0)
MCHC: 31.1 g/dL (ref 30.0–36.0)
MCV: 88.8 fL (ref 78.0–100.0)
PLATELETS: 371 10*3/uL (ref 150–400)
RBC: 4.45 MIL/uL (ref 3.87–5.11)
RDW: 15.5 % (ref 11.5–15.5)
WBC: 11.3 10*3/uL — ABNORMAL HIGH (ref 4.0–10.5)

## 2014-04-04 LAB — I-STAT TROPONIN, ED
Troponin i, poc: 0 ng/mL (ref 0.00–0.08)
Troponin i, poc: 0 ng/mL (ref 0.00–0.08)

## 2014-04-04 LAB — BASIC METABOLIC PANEL
Anion gap: 10 (ref 5–15)
BUN: 11 mg/dL (ref 6–23)
CO2: 22 mmol/L (ref 19–32)
CREATININE: 0.73 mg/dL (ref 0.50–1.10)
Calcium: 9.2 mg/dL (ref 8.4–10.5)
Chloride: 110 mmol/L (ref 96–112)
GFR calc Af Amer: >90 mL/min (ref >90)
Glucose, Bld: 115 mg/dL — ABNORMAL HIGH (ref 70–99)
POTASSIUM: 3.5 mmol/L (ref 3.5–5.1)
Sodium: 142 mmol/L (ref 135–145)

## 2014-04-04 LAB — BRAIN NATRIURETIC PEPTIDE: B Natriuretic Peptide: 40.3 pg/mL (ref 0.0–100.0)

## 2014-04-04 LAB — D-DIMER, QUANTITATIVE: D-Dimer, Quant: <0.27 ug/mL-FEU (ref 0.00–0.48)

## 2014-04-04 MED ORDER — MORPHINE SULFATE 4 MG/ML IJ SOLN
4.0000 mg | Freq: Once | INTRAMUSCULAR | Status: DC
  Filled 2014-04-04: qty 1

## 2014-04-04 MED ORDER — KETOROLAC TROMETHAMINE 30 MG/ML IJ SOLN: 30.0000 mg | Freq: Once | INTRAMUSCULAR | Status: DC

## 2014-04-04 MED ORDER — IBUPROFEN 800 MG PO TABS: 800.0000 mg | ORAL_TABLET | Freq: Three times a day (TID) | ORAL | Status: AC | PRN

## 2014-04-04 MED ORDER — HYDROCODONE-ACETAMINOPHEN 5-325 MG PO TABS: 1.0000 | ORAL_TABLET | Freq: Four times a day (QID) | ORAL | Status: DC | PRN

## 2014-04-04 MED ORDER — KETOROLAC TROMETHAMINE 30 MG/ML IJ SOLN
30.0000 mg | Freq: Once | INTRAMUSCULAR | Status: AC
  Administered 2014-04-04: 30 mg via INTRAVENOUS
  Filled 2014-04-04: qty 1

## 2014-04-04 NOTE — ED Notes (Signed)
PA at bedside.

## 2014-04-04 NOTE — ED Notes (Addendum)
Pt c/o L chest discomfort radiating into L shoulder and L arm and SOB x 2 days.  Pain score 6/10.  Denies n/v/d.  Pt reports she was doing "pretty much nothing" when the pain started.   Hx of smoking.  Denies cardiac Hx.  Pt was previously told to follow up with someone regarding her BP, but has not.

## 2014-04-04 NOTE — ED Provider Notes (Signed)
CSN: [id number]     Arrival date & time 04/04/14  1533 History   First MD Initiated Contact with Patient 04/04/14 1844     Chief Complaint  Patient presents with  . Chest Pain  . Shortness of Breath  . Hypertension     (Consider location/radiation/quality/duration/timing/severity/associated sxs/prior Treatment) HPI Patient presents to the emergency department with chest discomfort that radiates to her shoulder and back.  Patient states that this pain has been constant for the last 2 days.  The patient states that movement, palpation, deep breathing makes the pain worse.  Patient states that she does not have any nausea, vomiting, fever, cough, runny nose, sore throat, back pain, neck pain, abdominal pain, hematemesis, hemoptysis, leg swelling, calf pain or syncope.  The patient states that nothing seems to make her condition better.  Patient did not take any medications prior to arrival for her symptoms Past Medical History  Diagnosis Date  . GERD (gastroesophageal reflux disease)    Past Surgical History  Procedure Laterality Date  . Cholecystectomy    . Cesarean section     History reviewed. No pertinent family history. History  Substance Use Topics  . Smoking status: Current Every Day Smoker -- 0.00 packs/day    Types: Cigarettes  . Smokeless tobacco: Not on file  . Alcohol Use: Yes     Comment: occ   OB History    No data available     Review of Systems  All other systems negative except as documented in the HPI. All pertinent positives and negatives as reviewed in the HPI.   Allergies  Review of patient's allergies indicates no known allergies.  Home Medications   Prior to Admission medications   Medication Sig Start Date End Date Taking? Authorizing Provider  ibuprofen (ADVIL,MOTRIN) 200 MG tablet Take 600 mg by mouth every 6 (six) hours as needed for moderate pain (pain).    Yes Historical Provider, MD  ibuprofen (ADVIL,MOTRIN) 600 MG tablet Take 1 tablet (600  mg total) by mouth every 6 (six) hours as needed. 12/28/13  Yes Derwood KaplanAnkit Nanavati, MD  traMADol (ULTRAM) 50 MG tablet Take 1 tablet (50 mg total) by mouth every 6 (six) hours as needed. 12/28/13  Yes Derwood KaplanAnkit Nanavati, MD  albuterol (PROVENTIL HFA;VENTOLIN HFA) 108 (90 BASE) MCG/ACT inhaler Inhale 2 puffs into the lungs every 2 (two) hours as needed for wheezing or shortness of breath (cough). 05/10/13   Rudene AndaJacob Gray Lackey, PA-C  amoxicillin (AMOXIL) 500 MG capsule Take 1 capsule (500 mg total) by mouth 3 (three) times daily. Patient not taking: Reported on 04/04/2014 12/28/13   Derwood KaplanAnkit Nanavati, MD   BP 145/70 mmHg  Pulse 108  Temp(Src) 98 F (36.7 C) (Oral)  Resp 24  SpO2 99% Physical Exam  Constitutional: She is oriented to person, place, and time. She appears well-developed and well-nourished. No distress.  HENT:  Head: Normocephalic and atraumatic.  Mouth/Throat: Oropharynx is clear and moist.  Eyes: Pupils are equal, round, and reactive to light.  Neck: Normal range of motion. Neck supple.  Cardiovascular: Normal rate and regular rhythm.  Exam reveals no gallop and no friction rub.   No murmur heard. Pulmonary/Chest: Effort normal and breath sounds normal. No respiratory distress. She exhibits tenderness.  Abdominal: Soft. Bowel sounds are normal. She exhibits no distension. There is no tenderness.  Musculoskeletal: She exhibits no edema.  Neurological: She is alert and oriented to person, place, and time. She exhibits normal muscle tone. Coordination normal.  Skin: Skin  is warm and dry.  Nursing note and vitals reviewed.   ED Course  Procedures (including critical care time) Labs Review Labs Reviewed  CBC - Abnormal; Notable for the following:    WBC 11.3 (*)    All other components within normal limits  BASIC METABOLIC PANEL - Abnormal; Notable for the following:    Glucose, Bld 115 (*)    All other components within normal limits  BRAIN NATRIURETIC PEPTIDE  D-DIMER, QUANTITATIVE   I-STAT TROPOININ, ED  Rosezena Sensor, ED    Imaging Review Dg Chest 2 View  04/04/2014   CLINICAL DATA:  Initial encounter for two day history of chest pain with some shortness of breath.  EXAM: CHEST  2 VIEW  COMPARISON:  05/09/2013.  FINDINGS: The heart size and mediastinal contours are within normal limits. Both lungs are clear. The visualized skeletal structures are unremarkable.  IMPRESSION: No active cardiopulmonary disease.   Electronically Signed   By: Kennith Center M.D.   On: 04/04/2014 16:26   I advised the patient that her testing here tonight did not indicate any significant abnormalities that we had 2 sets of negative cardiac markers negative D-dimer in someone that is low risk for PE.  Thus, the patient that we cannot totally exclude a cardiac cause for her pain, but that based on the fact that has been constant chest pain for the last 2 days.  It seems less likely, along with her testing here today.  I advised her to return to the emergency department for any worsening in her condition  MDM   Final diagnoses:  None       Carlyle Dolly, PA-C 04/07/14 1610  Rolland Porter, MD 04/11/14 [phone]

## 2014-04-04 NOTE — Discharge Instructions (Signed)
Return here as needed.  Follow-up with the clinic provided.  °

## 2014-04-04 NOTE — Progress Notes (Signed)
  CARE MANAGEMENT ED NOTE 04/04/2014  Patient:  Manuella GhaziHILLERY,Gracelee   Account Number:  [id number]  Date Initiated:  04/04/2014  Documentation initiated by:  Radford PaxFERRERO,Jerome Viglione  Subjective/Objective Assessment:   Patient presents to Ed with chest discomfort radiating to left shoulder and left arm with shortness of breath for two days.     Subjective/Objective Assessment Detail:   Patient with history of GERD.     Action/Plan:   Action/Plan Detail:   Anticipated DC Date:  04/04/2014     Status Recommendation to Physician:   Result of Recommendation:    Other ED Services  Consult Working Plan    DC Planning Services  Other  PCP issues    Choice offered to / List presented to:            Status of service:  Completed, signed off  ED Comments:   ED Comments Detail:  EDCM spoke to patient at bedside.  [place].  [place]  [facility] encouraged patient o follow up with her pcp but offered patient resources in [place].  Patient agreeable to receive other resouces. Patient has [employer] Medicaid.  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 [facility]  [facility], informed patient of services there and walk in times.  EDCM also provided patient with list of pcps who accept self pay patients, list of discount pharmacies and websites needymeds.org and GoodRX.com for medication assistance, phone number to inquire about the orange card, phone number to inquire about Mediciad, phone number to inquire about the Affordable Care Act, financial resources in the community such as local churches, salvation army, urban ministries, and dental assistance for uninsured patients. Patient thankful for resources.  No further EDCM needs at this time.

## 2014-05-09 ENCOUNTER — Ambulatory Visit: Payer: Self-pay | Admitting: Family

## 2014-05-29 ENCOUNTER — Ambulatory Visit: Payer: Medicaid Other | Admitting: Family

## 2015-03-25 DIAGNOSIS — K219 Gastro-esophageal reflux disease without esophagitis: Secondary | ICD-10-CM | POA: Insufficient documentation

## 2015-03-25 DIAGNOSIS — IMO0001 Reserved for inherently not codable concepts without codable children: Secondary | ICD-10-CM | POA: Insufficient documentation

## 2015-03-25 DIAGNOSIS — L299 Pruritus, unspecified: Secondary | ICD-10-CM | POA: Insufficient documentation

## 2016-02-27 ENCOUNTER — Ambulatory Visit: Payer: Medicaid Other | Admitting: Dietician

## 2016-06-11 ENCOUNTER — Other Ambulatory Visit (HOSPITAL_BASED_OUTPATIENT_CLINIC_OR_DEPARTMENT_OTHER): Payer: Self-pay

## 2016-06-11 DIAGNOSIS — R5383 Other fatigue: Secondary | ICD-10-CM

## 2016-06-11 DIAGNOSIS — G4710 Hypersomnia, unspecified: Secondary | ICD-10-CM

## 2016-06-11 DIAGNOSIS — R0683 Snoring: Secondary | ICD-10-CM

## 2016-06-11 DIAGNOSIS — G4730 Sleep apnea, unspecified: Secondary | ICD-10-CM

## 2016-07-29 DIAGNOSIS — B372 Candidiasis of skin and nail: Secondary | ICD-10-CM | POA: Insufficient documentation

## 2016-07-29 DIAGNOSIS — M1990 Unspecified osteoarthritis, unspecified site: Secondary | ICD-10-CM | POA: Insufficient documentation

## 2016-07-29 DIAGNOSIS — M25562 Pain in left knee: Secondary | ICD-10-CM | POA: Insufficient documentation

## 2016-07-29 DIAGNOSIS — E559 Vitamin D deficiency, unspecified: Secondary | ICD-10-CM | POA: Insufficient documentation

## 2016-08-20 ENCOUNTER — Encounter (HOSPITAL_BASED_OUTPATIENT_CLINIC_OR_DEPARTMENT_OTHER): Payer: Medicaid Other

## 2016-09-08 ENCOUNTER — Ambulatory Visit (HOSPITAL_BASED_OUTPATIENT_CLINIC_OR_DEPARTMENT_OTHER): Payer: BC Managed Care – PPO | Attending: Internal Medicine | Admitting: Internal Medicine

## 2016-09-08 VITALS — Ht 70.0 in | Wt >= 6400 oz

## 2016-09-08 DIAGNOSIS — G4710 Hypersomnia, unspecified: Secondary | ICD-10-CM | POA: Insufficient documentation

## 2016-09-08 DIAGNOSIS — R5383 Other fatigue: Secondary | ICD-10-CM | POA: Insufficient documentation

## 2016-09-08 DIAGNOSIS — G4730 Sleep apnea, unspecified: Secondary | ICD-10-CM | POA: Insufficient documentation

## 2016-09-08 DIAGNOSIS — R0683 Snoring: Secondary | ICD-10-CM | POA: Diagnosis present

## 2016-09-19 DIAGNOSIS — R0683 Snoring: Secondary | ICD-10-CM

## 2016-09-19 NOTE — Procedures (Signed)
   Patient Name: Erika Sherman, [name] Date: 09/08/2016 Gender: Female D.O.B: 07/10/1975 Age (years): 41 Referring Provider: Jamison OkaMobolaji Bakare MD Height (inches): 70 Interpreting Physician: Jetty Duhamellinton Sheriann Newmann MD, ABSM Weight (lbs): 480 RPSGT: Wylie HailDavis, Rico BMI: 69 MRN: [id number] Neck Size: 19.00 CLINICAL INFORMATION Sleep Study Type: NPSG  Indication for sleep study: Fatigue, Snoring  Epworth Sleepiness Score: 5  SLEEP STUDY TECHNIQUE As per the AASM Manual for the Scoring of Sleep and Associated Events v2.3 (April 2016) with a hypopnea requiring 4% desaturations.  The channels recorded and monitored were frontal, central and occipital EEG, electrooculogram (EOG), submentalis EMG (chin), nasal and oral airflow, thoracic and abdominal wall motion, anterior tibialis EMG, snore microphone, electrocardiogram, and pulse oximetry.  MEDICATIONS Medications self-administered by patient taken the night of the study : none reported  SLEEP ARCHITECTURE The study was initiated at 10:09:17 PM and ended at 4:13:33 AM.  Sleep onset time was 12.6 minutes and the sleep efficiency was 62.9%. The total sleep time was 229.0 minutes.  Stage REM latency was 113.5 minutes.  The patient spent 20.74% of the night in stage N1 sleep, 65.50% in stage N2 sleep, 10.70% in stage N3 and 3.06% in REM.  Alpha intrusion was absent.  Supine sleep was 2.62%.  RESPIRATORY PARAMETERS The overall apnea/hypopnea index (AHI) was 5.2 per hour. There were 3 total apneas, including 3 obstructive, 0 central and 0 mixed apneas. There were 17 hypopneas and 14 RERAs.  The AHI during Stage REM sleep was 25.7 per hour.  AHI while supine was 0.0 per hour.  The mean oxygen saturation was 93.14%. The minimum SpO2 during sleep was 83.00%.  Loud snoring was noted during this study.  CARDIAC DATA The 2 lead EKG demonstrated sinus rhythm. The mean heart rate was 80.41 beats per minute. Other EKG findings include:  None.  LEG MOVEMENT DATA The total PLMS were 28 with a resulting PLMS index of 7.34. Associated arousal with leg movement index was 1.0 .  IMPRESSIONS - Mild obstructive sleep apnea occurred during this study (AHI = 5.2/h). Upper limit of normal is AHI 5.0/ hr, "Mild" is scored up to 15/ hr. - No significant central sleep apnea occurred during this study (CAI = 0.0/h). - Mild oxygen desaturation was noted during this study (Min O2 = 83.00%, Mean 93%). - The patient snored with Loud snoring volume. - No cardiac abnormalities were noted during this study. - Mild periodic limb movements of sleep occurred during the study. No significant associated arousals.  DIAGNOSIS - Obstructive Sleep Apnea (327.23 [G47.33 ICD-10])  RECOMMENDATIONS - Very mild obstructive sleep apnea. Conservative measures might emphasize weight loss and sleep off flat of back, possibly chin strap. - Be careful with alcohol, sedatives and other CNS depressants that may worsen sleep apnea and disrupt normal sleep architecture. - Sleep hygiene should be reviewed to assess factors that may improve sleep quality. - Weight management and regular exercise should be initiated or continued if appropriate.  [Electronically signed] 09/19/2016 02:12 PM  Jetty Duhamellinton Kaleyah Labreck MD, ABSM Diplomate, American Board of Sleep Medicine   NPI: [id number]  Waymon BudgeYOUNG,Kamron Portee D Diplomate, American Board of Sleep Medicine  ELECTRONICALLY SIGNED ON:  09/19/2016, 2:07 PM [facility] PH: [phone]   FX: [phone] ACCREDITED BY THE AMERICAN ACADEMY OF SLEEP MEDICINE

## 2016-12-07 DIAGNOSIS — E1142 Type 2 diabetes mellitus with diabetic polyneuropathy: Secondary | ICD-10-CM | POA: Insufficient documentation

## 2017-02-03 ENCOUNTER — Encounter: Payer: BC Managed Care – PPO | Admitting: Obstetrics & Gynecology

## 2017-02-10 ENCOUNTER — Other Ambulatory Visit (HOSPITAL_COMMUNITY)
Admission: RE | Admit: 2017-02-10 | Discharge: 2017-02-10 | Disposition: A | Discharge status: Home / Self Care | Payer: BC Managed Care – PPO | Source: Ambulatory Visit | Attending: Obstetrics and Gynecology | Admitting: Obstetrics and Gynecology

## 2017-02-10 ENCOUNTER — Ambulatory Visit (INDEPENDENT_AMBULATORY_CARE_PROVIDER_SITE_OTHER): Payer: BC Managed Care – PPO | Admitting: Obstetrics and Gynecology

## 2017-02-10 ENCOUNTER — Encounter: Payer: Self-pay | Admitting: Obstetrics and Gynecology

## 2017-02-10 VITALS — BP 164/103 | HR 96 | Ht 70.0 in | Wt >= 6400 oz

## 2017-02-10 DIAGNOSIS — E119 Type 2 diabetes mellitus without complications: Secondary | ICD-10-CM | POA: Insufficient documentation

## 2017-02-10 DIAGNOSIS — I10 Essential (primary) hypertension: Secondary | ICD-10-CM | POA: Insufficient documentation

## 2017-02-10 DIAGNOSIS — F17200 Nicotine dependence, unspecified, uncomplicated: Secondary | ICD-10-CM

## 2017-02-10 DIAGNOSIS — Z01419 Encounter for gynecological examination (general) (routine) without abnormal findings: Secondary | ICD-10-CM

## 2017-02-10 DIAGNOSIS — IMO0001 Reserved for inherently not codable concepts without codable children: Secondary | ICD-10-CM | POA: Insufficient documentation

## 2017-02-10 DIAGNOSIS — Z113 Encounter for screening for infections with a predominantly sexual mode of transmission: Secondary | ICD-10-CM

## 2017-02-10 DIAGNOSIS — Z8041 Family history of malignant neoplasm of ovary: Secondary | ICD-10-CM | POA: Insufficient documentation

## 2017-02-10 NOTE — Progress Notes (Signed)
  GYNECOLOGY ANNUAL PREVENTATIVE CARE ENCOUNTER NOTE  Subjective:   Erika Sherman is a 41 y.o. female  here for a annual gynecologic exam. Current complaints: concerned about genetic screening as mom was diagnosed with ovarian cancer around age 60.   Denies abnormal vaginal bleeding, discharge, or other gynecologic concerns. Reports she had cramping as a young woman but pain is much less with periods these days. Not on anything for birth control, declines any methods.   Would like STI testing, reports she had HIV/RPR/Hepatitis B/C at PCP which were negative. Wants Gc/ct/Tric  Did not take her BP meds today, states she normally does not forget meds and that's only reason why BP is high. Reports normally, BP is under better control.   Gynecologic History Patient's last menstrual period was 01/11/2017 (within days). Contraception: none Last Pap: many years ago Results were: normal Last mammogram: n/a  Obstetric History OB History  Gravida Para Term Preterm AB Living  1 1 1     1  SAB TAB Ectopic Multiple Live Births          1    # Outcome Date GA Lbr Len/2nd Weight Sex Delivery Anes PTL Lv  1 Term 1998     CS-Unspec   LIV      Past Medical History:  Diagnosis Date  . GERD (gastroesophageal reflux disease)     Past Surgical History:  Procedure Laterality Date  . CESAREAN SECTION    . CHOLECYSTECTOMY      Current Outpatient Medications on File Prior to Visit  Medication Sig Dispense Refill  . albuterol (PROVENTIL HFA;VENTOLIN HFA) 108 (90 BASE) MCG/ACT inhaler Inhale 2 puffs into the lungs every 2 (two) hours as needed for wheezing or shortness of breath (cough). 1 Inhaler 0  . gabapentin (NEURONTIN) 300 MG capsule Take 300 mg by mouth 3 (three) times daily.    . ibuprofen (ADVIL,MOTRIN) 800 MG tablet Take 1 tablet (800 mg total) by mouth every 8 (eight) hours as needed. 30 tablet 0  . lisinopril (PRINIVIL,ZESTRIL) 10 MG tablet Take 10 mg by mouth daily.    . metFORMIN  (GLUCOPHAGE-XR) 750 MG 24 hr tablet Take 750 mg by mouth daily.     No current facility-administered medications on file prior to visit.     No Known Allergies  Social History   Socioeconomic History  . Marital status: Single    Spouse name: Not on file  . Number of children: Not on file  . Years of education: Not on file  . Highest education level: Not on file  Social Needs  . Financial resource strain: Not on file  . Food insecurity - worry: Not on file  . Food insecurity - inability: Not on file  . Transportation needs - medical: Not on file  . Transportation needs - non-medical: Not on file  Occupational History  . Not on file  Tobacco Use  . Smoking status: Current Every Day Smoker    Packs/day: 0.20    Types: Cigarettes  . Smokeless tobacco: Never Used  Substance and Sexual Activity  . Alcohol use: Yes    Comment: occ  . Drug use: No  . Sexual activity: Yes    Birth control/protection: Injection  Other Topics Concern  . Not on file  Social History Narrative  . Not on file    Family History  Problem Relation Age of Onset  . Ovarian cancer Mother     The following portions of the patient's history   were reviewed and updated as appropriate: allergies, current medications, past family history, past medical history, past social history, past surgical history and problem list.  Review of Systems Pertinent items are noted in HPI.   Objective:  BP (!) 164/103   Pulse 96   Ht 5' 10" (1.778 m)   Wt (!) 471 lb (213.6 kg)   LMP 01/11/2017 (Within Days)   BMI 67.58 kg/m  CONSTITUTIONAL: Well-developed, well-nourished female in no acute distress. morbidly obese HENT:  Normocephalic, atraumatic, External right and left ear normal. Oropharynx is clear and moist EYES: Conjunctivae and EOM are normal. Pupils are equal, round, and reactive to light. No scleral icterus.  NECK: Normal range of motion, supple, no masses.  Normal thyroid.  SKIN: Skin is warm and dry. No  rash noted. Not diaphoretic. No erythema. No pallor. NEUROLOGIC: Alert and oriented to person, place, and time. Normal reflexes, muscle tone coordination. No cranial nerve deficit noted. PSYCHIATRIC: Normal mood and affect. Normal behavior. Normal judgment and thought content. CARDIOVASCULAR: Normal heart rate noted, regular rhythm RESPIRATORY: Clear to auscultation bilaterally. Effort and breath sounds normal, no problems with respiration noted. BREASTS: Symmetric in size. No masses, skin changes, nipple drainage, or lymphadenopathy. ABDOMEN: Soft, normal bowel sounds, no distention noted.  No tenderness, rebound or guarding, sweating and erythema in pannus fold, no evidence of infection  PELVIC: Normal appearing external genitalia; normal appearing vaginal mucosa and cervix.  No abnormal discharge noted. Blind pap smear obtained, not able to visualize cervix secondary to habitus and patient inability to maneuver. Unable to to palpate uterus, no tenderness in pelvis MUSCULOSKELETAL: Normal range of motion. No tenderness.  No cyanosis, clubbing, or edema.  2+ distal pulses.   Assessment and Plan:  1. Well woman exam with routine gynecological exam - Cytology - PAP - MM SCREENING BREAST TOMO BILATERAL; Future Flu shot given at PCP - reviewed decline of fertility age 42, rec if desires future fertility, do not delay  2. Essential hypertension F/u with PCP  3. Type 2 diabetes mellitus without complication, without long-term current use of insulin (HCC) F/u with PCP  4. Smoking Counseled to stop smoking  5. Routine screening for STI (sexually transmitted infection) GC/CT, trich sent  6. Family history of ovarian cancer Mom dx age 60 Elects for [facility] genetic counseling   Will follow up results of pap smear/STI screen and manage accordingly. Mammogram ordered  Routine preventative health maintenance measures emphasized. Please refer to After Visit Summary for other counseling  recommendations.    K. Meryl , M.D. Attending Obstetrician & Gynecologist, [facility], [facility]  

## 2017-02-11 ENCOUNTER — Telehealth: Payer: Self-pay | Admitting: *Deleted

## 2017-02-11 ENCOUNTER — Telehealth: Payer: Self-pay | Admitting: Genetic Counselor

## 2017-02-11 ENCOUNTER — Encounter: Payer: Self-pay | Admitting: Genetic Counselor

## 2017-02-11 LAB — CYTOLOGY - PAP
Chlamydia: NEGATIVE
Diagnosis: NEGATIVE
HPV (WINDOPATH): NOT DETECTED
Neisseria Gonorrhea: NEGATIVE
TRICH (WINDOWPATH): NEGATIVE

## 2017-02-11 NOTE — Telephone Encounter (Signed)
LM for her appt for genetic counseling @ [facility] for 02/26/17 @ 0930 due to hx of family ovarian cancer.

## 2017-02-11 NOTE — Telephone Encounter (Signed)
Genetic counseling appt has been scheduled for the pt to see Ofri on 12/21 at 930am. Referring office will notify the pt. Letter mailed.

## 2017-02-26 ENCOUNTER — Other Ambulatory Visit: Payer: BC Managed Care – PPO

## 2017-03-05 ENCOUNTER — Other Ambulatory Visit: Payer: Self-pay | Admitting: Obstetrics and Gynecology

## 2017-03-05 DIAGNOSIS — Z1231 Encounter for screening mammogram for malignant neoplasm of breast: Secondary | ICD-10-CM

## 2017-03-16 ENCOUNTER — Encounter: Payer: Self-pay | Admitting: *Deleted

## 2017-04-08 ENCOUNTER — Other Ambulatory Visit: Payer: BC Managed Care – PPO

## 2017-04-17 ENCOUNTER — Emergency Department (HOSPITAL_COMMUNITY)
Admission: EM | Admit: 2017-04-17 | Discharge: 2017-04-17 | Disposition: A | Discharge status: Home / Self Care | Payer: BC Managed Care – PPO | Attending: Emergency Medicine | Admitting: Emergency Medicine

## 2017-04-17 ENCOUNTER — Encounter (HOSPITAL_COMMUNITY): Payer: Self-pay

## 2017-04-17 DIAGNOSIS — I10 Essential (primary) hypertension: Secondary | ICD-10-CM | POA: Insufficient documentation

## 2017-04-17 DIAGNOSIS — R112 Nausea with vomiting, unspecified: Secondary | ICD-10-CM

## 2017-04-17 DIAGNOSIS — R0981 Nasal congestion: Secondary | ICD-10-CM | POA: Insufficient documentation

## 2017-04-17 DIAGNOSIS — Z79899 Other long term (current) drug therapy: Secondary | ICD-10-CM | POA: Insufficient documentation

## 2017-04-17 DIAGNOSIS — Z7984 Long term (current) use of oral hypoglycemic drugs: Secondary | ICD-10-CM | POA: Insufficient documentation

## 2017-04-17 DIAGNOSIS — F17210 Nicotine dependence, cigarettes, uncomplicated: Secondary | ICD-10-CM | POA: Insufficient documentation

## 2017-04-17 DIAGNOSIS — R197 Diarrhea, unspecified: Secondary | ICD-10-CM | POA: Diagnosis not present

## 2017-04-17 DIAGNOSIS — R1084 Generalized abdominal pain: Secondary | ICD-10-CM | POA: Diagnosis not present

## 2017-04-17 DIAGNOSIS — M7918 Myalgia, other site: Secondary | ICD-10-CM | POA: Diagnosis not present

## 2017-04-17 DIAGNOSIS — E119 Type 2 diabetes mellitus without complications: Secondary | ICD-10-CM | POA: Insufficient documentation

## 2017-04-17 LAB — COMPREHENSIVE METABOLIC PANEL
ALK PHOS: 69 U/L (ref 38–126)
ALT: 11 U/L — AB (ref 14–54)
AST: 20 U/L (ref 15–41)
Albumin: 3.7 g/dL (ref 3.5–5.0)
Anion gap: 8 (ref 5–15)
BUN: 8 mg/dL (ref 6–20)
CO2: 24 mmol/L (ref 22–32)
CREATININE: 0.74 mg/dL (ref 0.44–1.00)
Calcium: 8.8 mg/dL — ABNORMAL LOW (ref 8.9–10.3)
Chloride: 105 mmol/L (ref 101–111)
Glucose, Bld: 142 mg/dL — ABNORMAL HIGH (ref 65–99)
Potassium: 3.3 mmol/L — ABNORMAL LOW (ref 3.5–5.1)
Sodium: 137 mmol/L (ref 135–145)
Total Bilirubin: 0.8 mg/dL (ref 0.3–1.2)
Total Protein: 7.6 g/dL (ref 6.5–8.1)

## 2017-04-17 LAB — LIPASE, BLOOD: Lipase: 22 U/L (ref 11–51)

## 2017-04-17 LAB — CBC
HCT: 40.4 % (ref 36.0–46.0)
Hemoglobin: 12.9 g/dL (ref 12.0–15.0)
MCH: 28.6 pg (ref 26.0–34.0)
MCHC: 31.9 g/dL (ref 30.0–36.0)
MCV: 89.6 fL (ref 78.0–100.0)
PLATELETS: 355 10*3/uL (ref 150–400)
RBC: 4.51 MIL/uL (ref 3.87–5.11)
RDW: 14.9 % (ref 11.5–15.5)
WBC: 12.9 10*3/uL — AB (ref 4.0–10.5)

## 2017-04-17 LAB — I-STAT BETA HCG BLOOD, ED (MC, WL, AP ONLY)

## 2017-04-17 MED ORDER — POTASSIUM CHLORIDE CRYS ER 20 MEQ PO TBCR
40.0000 meq | EXTENDED_RELEASE_TABLET | Freq: Once | ORAL | Status: AC
  Administered 2017-04-17: 40 meq via ORAL
  Filled 2017-04-17: qty 2

## 2017-04-17 MED ORDER — ONDANSETRON 4 MG PO TBDP: 4.0000 mg | ORAL_TABLET | Freq: Three times a day (TID) | ORAL | 0 refills | Status: AC | PRN

## 2017-04-17 MED ORDER — ONDANSETRON HCL 4 MG/2ML IJ SOLN
4.0000 mg | Freq: Once | INTRAMUSCULAR | Status: AC
  Administered 2017-04-17: 4 mg via INTRAVENOUS
  Filled 2017-04-17: qty 2

## 2017-04-17 MED ORDER — SODIUM CHLORIDE 0.9 % IV BOLUS (SEPSIS)
1000.0000 mL | Freq: Once | INTRAVENOUS | Status: AC
  Administered 2017-04-17: 1000 mL via INTRAVENOUS

## 2017-04-17 MED ORDER — KETOROLAC TROMETHAMINE 30 MG/ML IJ SOLN
30.0000 mg | Freq: Once | INTRAMUSCULAR | Status: AC
  Administered 2017-04-17: 30 mg via INTRAVENOUS
  Filled 2017-04-17: qty 1

## 2017-04-17 NOTE — ED Provider Notes (Signed)
[facility]-EMERGENCY DEPT Provider Note   CSN: [id number] Arrival date & time: 04/17/17  0631     History   Chief Complaint Chief Complaint  Patient presents with  . Abdominal Pain  . Emesis    HPI Erika Sherman is a 42 y.o. female with a past medical history of GERD, hypertension, diabetes, who presents to ED for evaluation of 4-day history of abdominal cramping, several episodes of diarrhea and several episodes of NBNB emesis.  States that she ate a meal at [employer] prior to symptom onset but is unsure if this is related to her symptoms.  She has tried taking Tylenol, Maalox with no improvement in her symptoms because she ends up vomiting up the medications.  No sick contacts at home with similar symptoms.  She also endorses generalized body aches and nasal congestion.  Previous abdominal surgeries include cholecystectomy over 5 years ago.  She denies fever, lightheadedness, loss of consciousness, urinary symptoms, hematochezia, melena, recent travel, vaginal discharge or abnormal vaginal bleeding.  HPI  Past Medical History:  Diagnosis Date  . GERD (gastroesophageal reflux disease)     Patient Active Problem List   Diagnosis Date Noted  . HTN (hypertension) 02/10/2017  . DM (diabetes mellitus) (HCC) 02/10/2017  . Smoking 02/10/2017  . Family history of ovarian cancer 02/10/2017    Past Surgical History:  Procedure Laterality Date  . CESAREAN SECTION    . CHOLECYSTECTOMY      OB History    Gravida Para Term Preterm AB Living   1 1 1     1    SAB TAB Ectopic Multiple Live Births           1       Home Medications    Prior to Admission medications   Medication Sig Start Date End Date Taking? Authorizing Provider  acetaminophen (TYLENOL) 500 MG tablet Take 1,000 mg by mouth every 6 (six) hours as needed for mild pain.   Yes [provider]  albuterol (PROVENTIL HFA;VENTOLIN HFA) 108 (90 BASE) MCG/ACT inhaler Inhale 2 puffs into  the lungs every 2 (two) hours as needed for wheezing or shortness of breath (cough). 05/10/13  Yes Cristobal Goldmann, PA-C  gabapentin (NEURONTIN) 300 MG capsule Take 300 mg by mouth 3 (three) times daily.   Yes [provider]  lisinopril (PRINIVIL,ZESTRIL) 10 MG tablet Take 10 mg by mouth daily.   Yes [provider]  metFORMIN (GLUCOPHAGE-XR) 750 MG 24 hr tablet Take 750 mg by mouth daily.   Yes [provider]  omeprazole (PRILOSEC) 20 MG capsule Take 20 mg by mouth daily.   Yes [provider]  ibuprofen (ADVIL,MOTRIN) 800 MG tablet Take 1 tablet (800 mg total) by mouth every 8 (eight) hours as needed. Patient not taking: Reported on 04/17/2017 04/04/14   Charlestine Night, PA-C  ondansetron (ZOFRAN ODT) 4 MG disintegrating tablet Take 1 tablet (4 mg total) by mouth every 8 (eight) hours as needed for nausea or vomiting. 04/17/17   Dietrich Pates, PA-C    Family History Family History  Problem Relation Age of Onset  . Ovarian cancer Mother     Social History Social History   Tobacco Use  . Smoking status: Current Every Day Smoker    Packs/day: 0.20    Types: Cigarettes  . Smokeless tobacco: Never Used  Substance Use Topics  . Alcohol use: Yes    Comment: occ  . Drug use: No     Allergies  Lactose intolerance (gi)   Review of Systems Review of Systems  Constitutional: Negative for appetite change, chills and fever.  HENT: Negative for ear pain, rhinorrhea, sneezing and sore throat.   Eyes: Negative for photophobia and visual disturbance.  Respiratory: Negative for cough, chest tightness, shortness of breath and wheezing.   Cardiovascular: Negative for chest pain and palpitations.  Gastrointestinal: Positive for abdominal pain, diarrhea, nausea and vomiting. Negative for blood in stool and constipation.  Genitourinary: Negative for dysuria, hematuria and urgency.  Musculoskeletal: Positive for myalgias.  Skin: Negative for rash.    Neurological: Negative for dizziness, weakness and light-headedness.     Physical Exam Updated Vital Signs BP (!) 152/80 (BP Location: Left Arm)   Pulse 96   Temp 98.4 F (36.9 C) (Oral)   Resp 20   Ht 5\' 9"  (1.753 m)   Wt (!) 214.1 kg (472 lb)   LMP  (LMP Unknown)   SpO2 96%   BMI 69.70 kg/m   Physical Exam  Constitutional: She appears well-developed and well-nourished. No distress.  Nontoxic appearing and in no acute distress. Morbidly obese appearing female.  HENT:  Head: Normocephalic and atraumatic.  Nose: Nose normal.  Eyes: Conjunctivae and EOM are normal. Right eye exhibits no discharge. Left eye exhibits no discharge. No scleral icterus.  Neck: Normal range of motion. Neck supple.  Cardiovascular: Normal rate, regular rhythm, normal heart sounds and intact distal pulses. Exam reveals no gallop and no friction rub.  No murmur heard. Pulmonary/Chest: Effort normal and breath sounds normal. No respiratory distress.  Abdominal: Soft. Bowel sounds are normal. She exhibits no distension. There is tenderness. There is no guarding.  Generalized abdominal tenderness to palpation.  No rebound or guarding noted.  Musculoskeletal: Normal range of motion. She exhibits no edema.  Neurological: She is alert. She exhibits normal muscle tone. Coordination normal.  Skin: Skin is warm and dry. No rash noted.  Psychiatric: She has a normal mood and affect.  Nursing note and vitals reviewed.    ED Treatments / Results  Labs (all labs ordered are listed, but only abnormal results are displayed) Labs Reviewed  COMPREHENSIVE METABOLIC PANEL - Abnormal; Notable for the following components:      Result Value   Potassium 3.3 (*)    Glucose, Bld 142 (*)    Calcium 8.8 (*)    ALT 11 (*)    All other components within normal limits  CBC - Abnormal; Notable for the following components:   WBC 12.9 (*)    All other components within normal limits  LIPASE, BLOOD  URINALYSIS, ROUTINE  W REFLEX MICROSCOPIC  I-STAT BETA HCG BLOOD, ED ([facility], [facility], [facility] ONLY)    EKG  EKG Interpretation None       Radiology No results found.  Procedures Procedures (including critical care time)  Medications Ordered in ED Medications  sodium chloride 0.9 % bolus 1,000 mL (0 mLs Intravenous Stopped 04/17/17 1211)  ketorolac (TORADOL) 30 MG/ML injection 30 mg (30 mg Intravenous Given 04/17/17 1022)  ondansetron (ZOFRAN) injection 4 mg (4 mg Intravenous Given 04/17/17 1022)  potassium chloride SA (K-DUR,KLOR-CON) CR tablet 40 mEq (40 mEq Oral Given 04/17/17 1052)     Initial Impression / Assessment and Plan / ED Course  I have reviewed the triage vital signs and the nursing notes.  Pertinent labs & imaging results that were available during my care of the patient were reviewed by me and considered in my medical decision making (see chart for  details).     Patient presents to ED for evaluation of 3-day history of abdominal cramping, several episodes of diarrhea and several episodes of vomiting.  This began after she ate a meal at [employer].  She has tried home medications including Tylenol and Maalox with no improvement in her symptoms.  Physical exam she is overall well-appearing.  She is an obese female with palpation of the abdomen with no rebound or guarding. She does not appear uncomfortable.  Other vital signs are within normal limits.  Lab work significant for mild leukocytosis at 12.9 mild hypokalemia 3.3.  This was repleted orally.  She has complete resolution of her symptoms with Toradol, Zofran and fluids given here in the ED.  She is tolerating p.o. intake without difficulty here today.  At this time I have low suspicion for acute intra-abdominal surgical process being the cause of her pain.  I suspect that her symptoms could be related to her meal at [employer].  Will discharge home with Zofran and instructions on slowly advancing her diet.  Advised to follow-up with PCP for further  evaluation if symptoms persist. Patient appears stable for discharge at this time. Strict return precautions given.  Portions of this note were generated with Scientist, clinical (histocompatibility and immunogenetics). Dictation errors may occur despite best attempts at proofreading.   Final Clinical Impressions(s) / ED Diagnoses   Final diagnoses:  Nausea vomiting and diarrhea    ED Discharge Orders        Ordered    ondansetron (ZOFRAN ODT) 4 MG disintegrating tablet  Every 8 hours PRN     04/17/17 1205       Dietrich Pates, PA-C 04/17/17 1215    Azalia Bilis, MD 04/18/17 [phone]

## 2017-04-17 NOTE — ED Triage Notes (Signed)
Pt states n/v since Wednesday.  No diarrhea.  No fever.  Feels weak and unable to eat.

## 2017-04-17 NOTE — ED Notes (Signed)
ED Provider at bedside. 

## 2017-04-17 NOTE — Discharge Instructions (Signed)
Please read attached information regarding your condition.  Take Zofran as needed for nausea. Push fluids to maintain adequate hydration.  Slowly advance her diet as tolerated.  Follow-up with primary care provider for further evaluation. Return to ED for worsening symptoms, severe abdominal pain, vomiting up blood, lightheadedness or loss of consciousness.

## 2018-01-11 ENCOUNTER — Ambulatory Visit
Admission: RE | Admit: 2018-01-11 | Discharge: 2018-01-11 | Disposition: A | Discharge status: Home / Self Care | Payer: BC Managed Care – PPO | Source: Ambulatory Visit | Attending: Family Medicine | Admitting: Family Medicine

## 2018-01-11 ENCOUNTER — Other Ambulatory Visit: Payer: Self-pay | Admitting: Family Medicine

## 2018-01-11 DIAGNOSIS — R52 Pain, unspecified: Secondary | ICD-10-CM

## 2018-10-05 ENCOUNTER — Other Ambulatory Visit: Payer: Self-pay | Admitting: Family Medicine

## 2018-10-05 DIAGNOSIS — R1031 Right lower quadrant pain: Secondary | ICD-10-CM

## 2018-10-17 ENCOUNTER — Inpatient Hospital Stay: Admission: RE | Admit: 2018-10-17 | Payer: BC Managed Care – PPO | Source: Ambulatory Visit

## 2018-10-18 ENCOUNTER — Other Ambulatory Visit: Payer: BC Managed Care – PPO

## 2018-10-24 ENCOUNTER — Ambulatory Visit
Admission: RE | Admit: 2018-10-24 | Discharge: 2018-10-24 | Disposition: A | Discharge status: Home / Self Care | Payer: BC Managed Care – PPO | Source: Ambulatory Visit | Attending: Family Medicine | Admitting: Family Medicine

## 2018-10-24 ENCOUNTER — Other Ambulatory Visit: Payer: Self-pay

## 2018-10-24 DIAGNOSIS — R1031 Right lower quadrant pain: Secondary | ICD-10-CM

## 2018-10-24 MED ORDER — IOPAMIDOL (ISOVUE-300) INJECTION 61%
125.0000 mL | Freq: Once | INTRAVENOUS | Status: AC | PRN
  Administered 2018-10-24: 125 mL via INTRAVENOUS

## 2019-11-17 ENCOUNTER — Other Ambulatory Visit: Payer: Self-pay

## 2019-11-17 ENCOUNTER — Encounter: Payer: Self-pay | Admitting: Family Medicine

## 2019-11-17 ENCOUNTER — Ambulatory Visit (INDEPENDENT_AMBULATORY_CARE_PROVIDER_SITE_OTHER): Payer: BC Managed Care – PPO | Admitting: Family Medicine

## 2019-11-17 DIAGNOSIS — M545 Low back pain, unspecified: Secondary | ICD-10-CM

## 2019-11-17 DIAGNOSIS — G8929 Other chronic pain: Secondary | ICD-10-CM | POA: Diagnosis not present

## 2019-11-17 MED ORDER — BACLOFEN 10 MG PO TABS: 5.0000 mg | ORAL_TABLET | Freq: Three times a day (TID) | ORAL | 3 refills | Status: AC | PRN

## 2019-11-17 NOTE — Progress Notes (Signed)
Office Visit Note   Patient: Erika Sherman           Date of Birth: 11-Dec-1975           MRN: [id number] Visit Date: 11/17/2019 Requested by: Harvest Forest, MD [address] [place],  [place] [address] PCP: Harvest Forest, MD  Subjective: Chief Complaint  Patient presents with  . Lower Back - Pain    Pain in lower back and down both legs since she fell last October. Fell in a squatting position (while being helped up from a seated position by her sister) - her feet went out from under her and she fell backward. Taking Gabapentin & Cyclobenzaprine.    HPI: She is here with low back and bilateral leg pain. About a year ago she was being helped up from a seated position, lost her balance and fell into a squatting position. She has had pain in her low back and into both legs since then. She cannot stand or walk for very long because of her pain. She is on gabapentin chronically and has tried Flexeril, but this did not seem to be helping.  She has diabetes and hypertension, morbid obesity.               ROS:   All other systems were reviewed and are negative.  Objective: Vital Signs: There were no vitals taken for this visit.  Physical Exam:  General:  Alert and oriented, in no acute distress. Pulm:  Breathing unlabored. Psy:  Normal mood, congruent affect  Low back: She has some tenderness in the upper lumbar spinous processes. Straight leg raise is equivocal. She has pain with bilateral hip internal rotation but her range of motion is still good. Lower extremity strength and reflexes remain normal.  Imaging: No results found.  Assessment & Plan: 1. Chronic low back and bilateral leg pain almost 1 year status post fall, question lumbar disc protrusion. -We will try physical therapy. Baclofen as needed. If she fails to improve, we will order x-rays and possibly MRI scan to be done at [facility] imaging due to patient's size.     Procedures: No  procedures performed  No notes on file     PMFS History: Patient Active Problem List   Diagnosis Date Noted  . HTN (hypertension) 02/10/2017  . DM (diabetes mellitus) (HCC) 02/10/2017  . Smoking 02/10/2017  . Family history of ovarian cancer 02/10/2017  . Diabetic polyneuropathy (HCC) 12/07/2016  . Candidiasis of skin 07/29/2016  . Osteoarthritis 07/29/2016  . Pain in left knee 07/29/2016  . Vitamin D deficiency 07/29/2016  . Elevated blood pressure 03/25/2015  . GERD (gastroesophageal reflux disease) 03/25/2015  . Itching 03/25/2015  . Dysmenorrhea 06/09/2013  . Morbid obesity (HCC) 12/29/2012   Past Medical History:  Diagnosis Date  . GERD (gastroesophageal reflux disease)     Family History  Problem Relation Age of Onset  . Ovarian cancer Mother     Past Surgical History:  Procedure Laterality Date  . CESAREAN SECTION    . CHOLECYSTECTOMY     Social History   Occupational History  . Not on file  Tobacco Use  . Smoking status: Current Every Day Smoker    Packs/day: 0.20    Types: Cigarettes  . Smokeless tobacco: Never Used  Substance and Sexual Activity  . Alcohol use: Yes    Comment: occ  . Drug use: No  . Sexual activity: Yes    Birth control/protection: Injection

## 2019-12-14 IMAGING — CT CT ABDOMEN AND PELVIS WITH CONTRAST
1 of 3 series · 12 of 32 positions shown, 18 images · IV contrast (APPLIED)
Comparison: CT abdomen/pelvis 01/07/2010, pelvic ultrasound
11/26/2010

CLINICAL DATA: Abdominal pain x3 weeks, history of cholecystectomy,
C-section.

EXAM:
CT ABDOMEN AND PELVIS WITH CONTRAST
TECHNIQUE: Multidetector CT imaging of the abdomen and pelvis was performed
using the standard protocol following bolus administration of
intravenous contrast.
CONTRAST:  125mL [id number] IOPAMIDOL ([id number]) INJECTION 61%

[Series 2: abd/pelvis w/cm · axial · 0.98mm/px · z∈[-542,-107]mm · 12 of 103 slices shown, 18 images]
[im 8/103  soft-tissue]
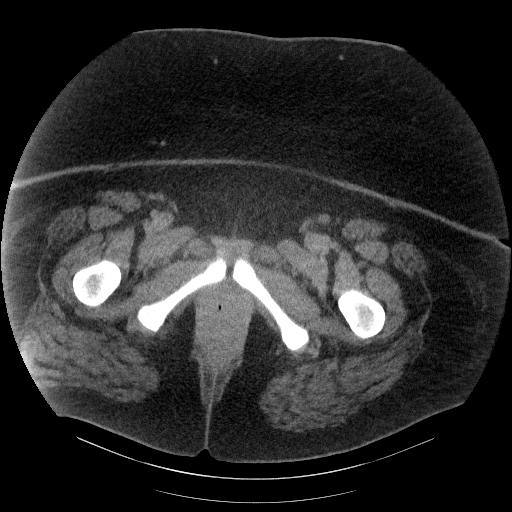
[im 8/103  bone]
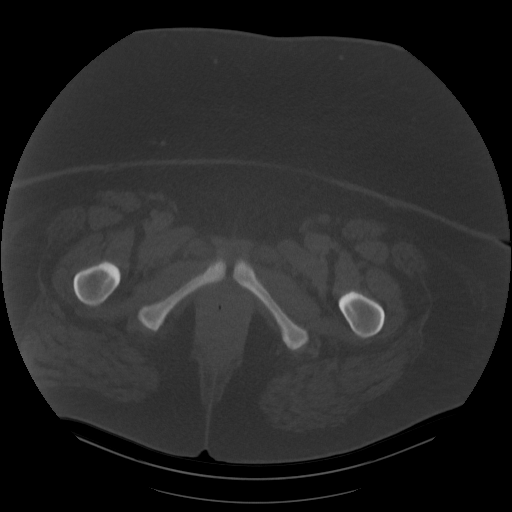
[im 15/103  soft-tissue]
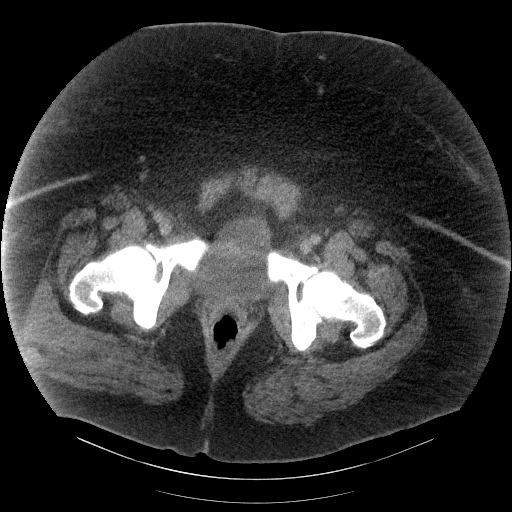
[im 22/103  soft-tissue]
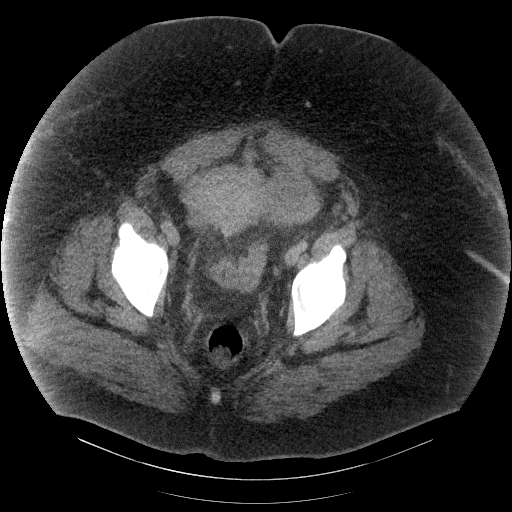
[im 30/103  soft-tissue]
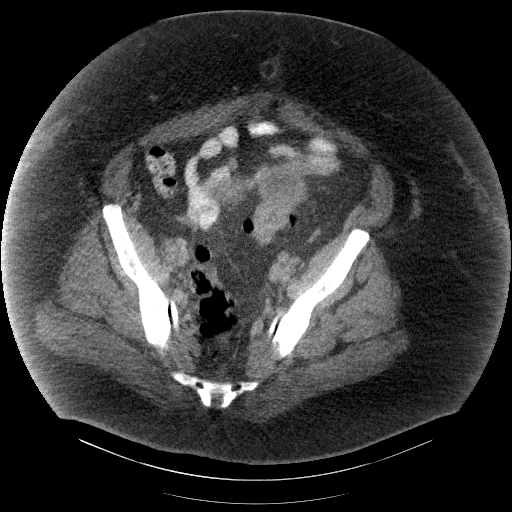
[im 37/103  soft-tissue]
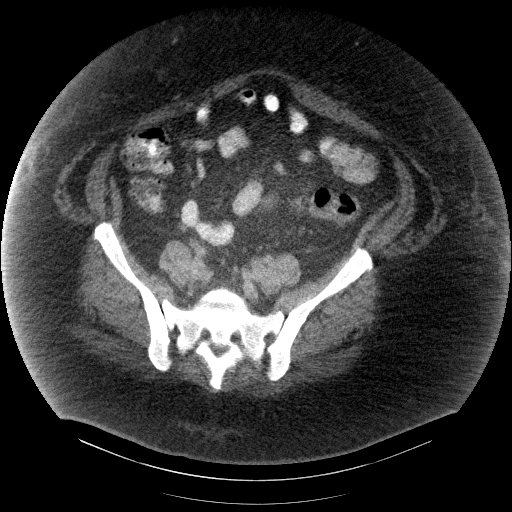
[im 44/103  soft-tissue]
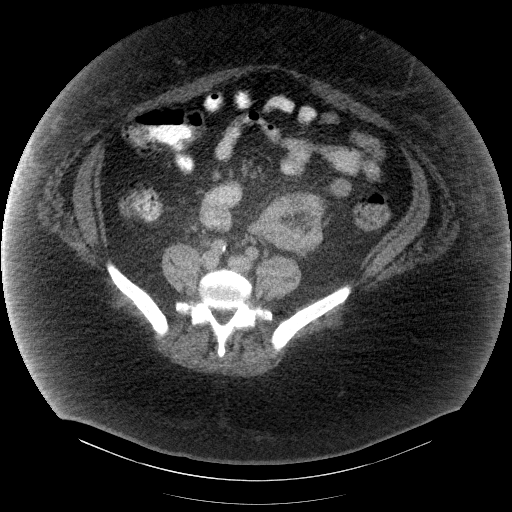
[im 59/103  soft-tissue]
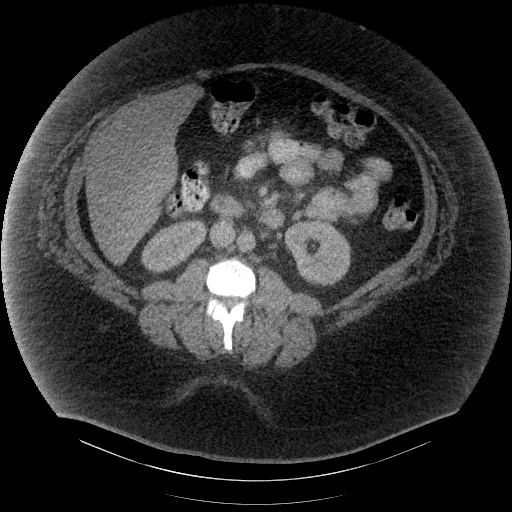
[im 66/103  soft-tissue]
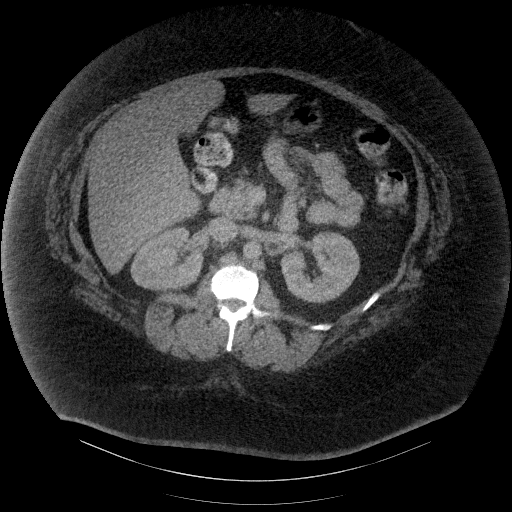
[im 73/103  soft-tissue]
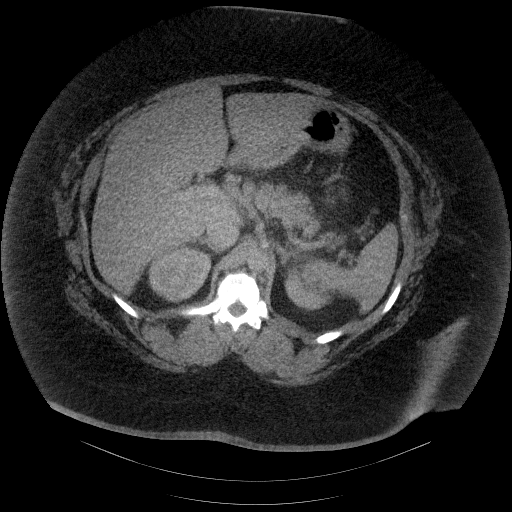
[im 73/103  lung]
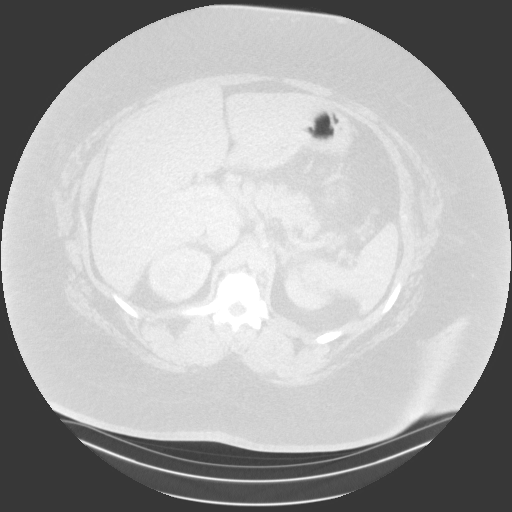
[im 73/103  bone]
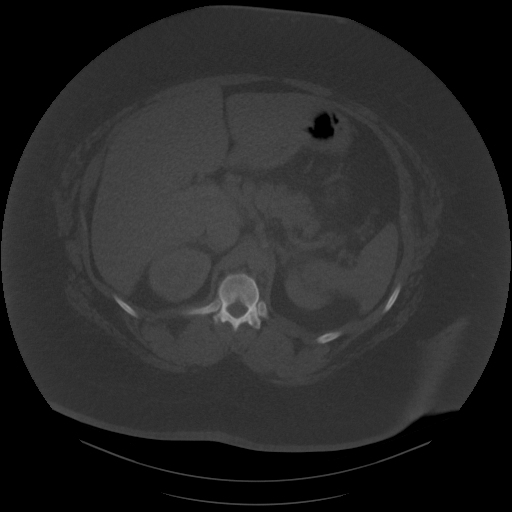
[im 81/103  soft-tissue]
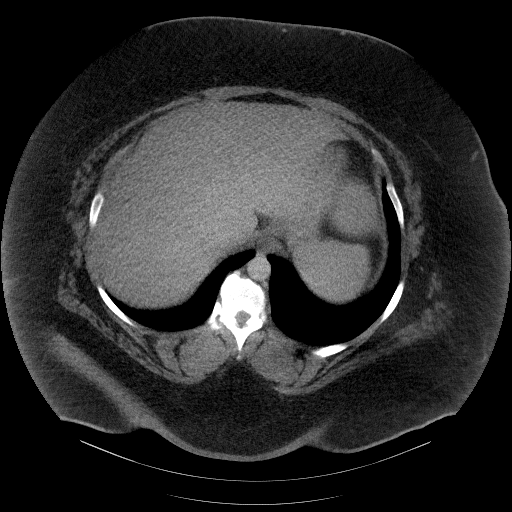
[im 81/103  lung]
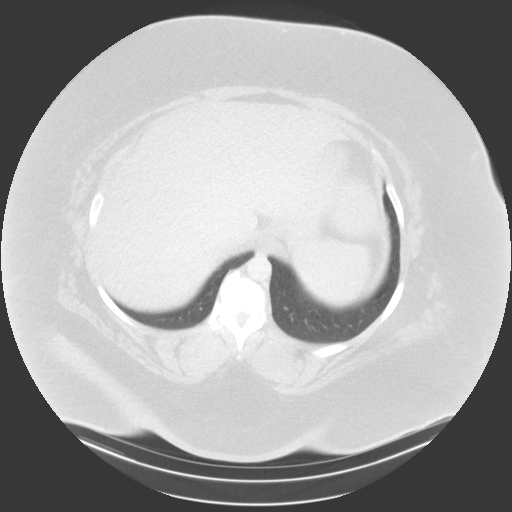
[im 88/103  soft-tissue]
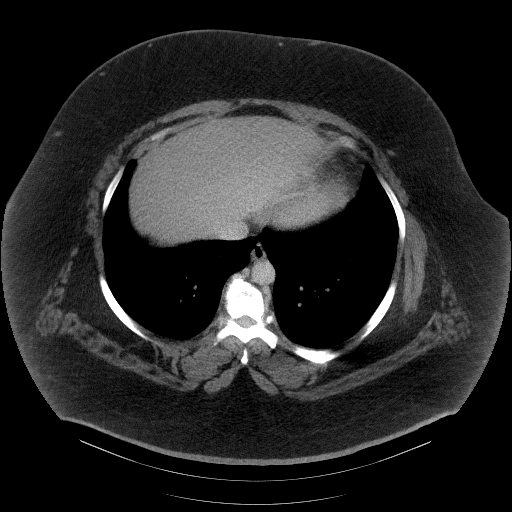
[im 88/103  lung]
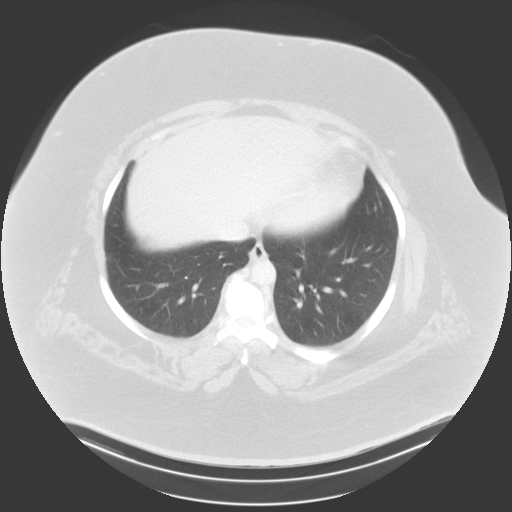
[im 95/103  soft-tissue]
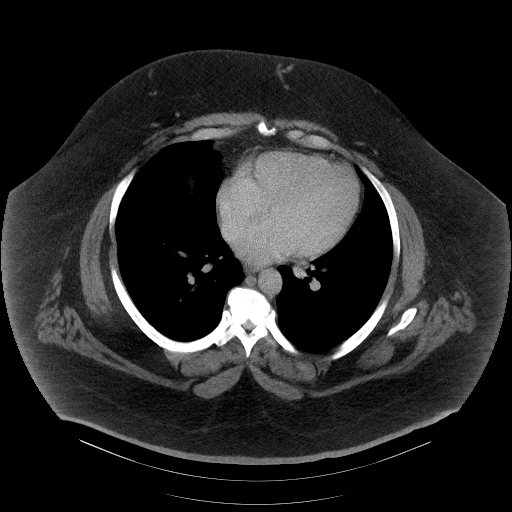
[im 95/103  lung]
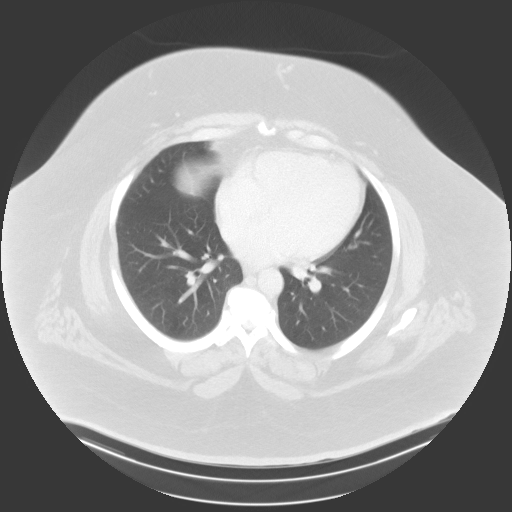

[12 of 32 positions shown; findings below may reference images not displayed]

FINDINGS: Degradation of image quality due to body habitus.

Lower chest: The imaged lung bases are clear.The imaged heart is
normal in size.

Hepatobiliary: No evidence of focal liver lesion. Diffuse
hypoattenuation of the hepatic parenchyma compatible with steatosis.
Status post cholecystectomy. No biliary ductal dilatation.

Pancreas: No evidence of focal lesion.No ductal dilatation or
peripancreatic edema.

Spleen: No evidence of focal lesion.No splenomegaly.

Adrenals/Urinary Tract: No adrenal gland mass.No evidence of renal
mass.No hydronephrosis.Symmetric renal enhancement.The bladder is
underdistended, limiting evaluation.

Stomach/Bowel: The stomach is unremarkable.No dilated loops of bowel
or appreciable bowel wall thickening. Mild sigmoid diverticulosis
without CT evidence of diverticulitis. The appendix is not
definitively identified, although there are no pericecal
inflammatory changes.

Vascular/Lymphatic: No abdominal aortic aneurysm.

Reproductive: Incompletely assessed by CT modality.7.2 x 5.6 cm
low-density mass arising from the left adnexa, incompletely
assessed.

Other: No ascites. The body wall is unremarkable.

Musculoskeletal: No acute bony abnormality.Multilevel ventrolateral
osteophytes within the imaged lower thoracic spine, afinding which
may be seen in the setting of diffuse idiopathic skeletal
hyperostosis (DISH).
IMPRESSION: 7.2 cm low-density left adnexal mass, incompletely assessed. Pelvic
ultrasound is recommended for further evaluation.

Hepatic steatosis.

Sigmoid diverticulosis without CT evidence of diverticulitis.

Status post cholecystectomy.

## 2019-12-21 ENCOUNTER — Telehealth: Payer: Self-pay | Admitting: Family Medicine

## 2019-12-21 DIAGNOSIS — M545 Low back pain, unspecified: Secondary | ICD-10-CM

## 2019-12-21 DIAGNOSIS — G8929 Other chronic pain: Secondary | ICD-10-CM

## 2019-12-21 NOTE — Telephone Encounter (Signed)
Patient called requesting a call back. Patient has a lot of medical questions. Patient states a referral was sent in for back pains to [facility] but has problems with her legs and feet. Explained to patient that she may have to be seen for other body parts. Please call this patient about this matter. Patient phone number [phone].

## 2019-12-22 NOTE — Telephone Encounter (Signed)
I called, no answer.  ?

## 2019-12-25 NOTE — Telephone Encounter (Signed)
I tried calling the patient at the number she left - no answer and mailbox was full. Will try again later.

## 2019-12-27 NOTE — Telephone Encounter (Signed)
I called the patient again and reached her this time: she cannot stand or walk much due to her pain. For that reason, she has not done any PT. The patient is asking that we get some xrays and MRI. I did advise her that the MRI might not gain prior authorization, due to not having any PT. Please advise on how to proceed.

## 2019-12-28 NOTE — Telephone Encounter (Signed)
X-Rays and MRI ordered [facility] for both).  But as you said, insurance may not cover.  Another option would be referral for ESI.

## 2019-12-28 NOTE — Telephone Encounter (Signed)
I called and advised the patient of the plan. She will be notified if there is an issue with getting this authorized by her insurance company.

## 2020-01-17 ENCOUNTER — Inpatient Hospital Stay: Admission: RE | Admit: 2020-01-17 | Payer: BC Managed Care – PPO | Source: Ambulatory Visit

## 2020-01-22 ENCOUNTER — Telehealth: Payer: Self-pay | Admitting: Family Medicine

## 2020-01-22 NOTE — Telephone Encounter (Signed)
Pt called regarding her MRI, pt states she wanted the mri to be filed under medicaid instead of [employer].  We have no Medicaid information in her chart at the moment. FYI

## 2020-01-25 NOTE — Telephone Encounter (Signed)
CPT Code 24235 Description: MRI LUMBAR SPINE W/O CONTRAST Case Number: [id number] Review Date: 01/25/2020 9:39:46 AM Expiration Date: N/A Status: Your case has been sent to clinical review. You will be notified via fax within 2 business days if additional clinical information is needed. If you wish to speak with a representative at anytime, please call [phone].

## 2020-02-23 ENCOUNTER — Other Ambulatory Visit: Payer: Self-pay | Admitting: Family Medicine

## 2020-02-23 DIAGNOSIS — G8929 Other chronic pain: Secondary | ICD-10-CM

## 2020-02-23 DIAGNOSIS — M545 Low back pain, unspecified: Secondary | ICD-10-CM

## 2020-03-14 ENCOUNTER — Ambulatory Visit: Payer: BC Managed Care – PPO | Admitting: Rehabilitative and Restorative Service Providers"

## 2020-03-19 ENCOUNTER — Other Ambulatory Visit: Payer: BC Managed Care – PPO

## 2020-03-28 ENCOUNTER — Ambulatory Visit: Payer: BC Managed Care – PPO | Admitting: Physical Therapy

## 2020-04-08 ENCOUNTER — Ambulatory Visit: Payer: BC Managed Care – PPO | Admitting: Physical Therapy

## 2020-04-15 ENCOUNTER — Ambulatory Visit: Payer: BC Managed Care – PPO | Admitting: Rehabilitative and Restorative Service Providers"

## 2020-04-22 ENCOUNTER — Other Ambulatory Visit: Payer: Self-pay

## 2020-04-22 ENCOUNTER — Ambulatory Visit: Payer: BC Managed Care – PPO | Admitting: Physical Therapy

## 2020-04-23 ENCOUNTER — Ambulatory Visit (HOSPITAL_BASED_OUTPATIENT_CLINIC_OR_DEPARTMENT_OTHER): Payer: BC Managed Care – PPO | Attending: Family Medicine | Admitting: Physical Therapy

## 2020-04-23 ENCOUNTER — Ambulatory Visit (HOSPITAL_BASED_OUTPATIENT_CLINIC_OR_DEPARTMENT_OTHER): Payer: BC Managed Care – PPO | Admitting: Physical Therapy

## 2020-04-23 ENCOUNTER — Ambulatory Visit: Payer: BC Managed Care – PPO | Admitting: Physical Therapy

## 2020-04-23 DIAGNOSIS — R293 Abnormal posture: Secondary | ICD-10-CM | POA: Insufficient documentation

## 2020-04-23 DIAGNOSIS — M5442 Lumbago with sciatica, left side: Secondary | ICD-10-CM | POA: Diagnosis not present

## 2020-04-23 DIAGNOSIS — R262 Difficulty in walking, not elsewhere classified: Secondary | ICD-10-CM | POA: Insufficient documentation

## 2020-04-23 DIAGNOSIS — M6281 Muscle weakness (generalized): Secondary | ICD-10-CM | POA: Insufficient documentation

## 2020-04-23 DIAGNOSIS — G8929 Other chronic pain: Secondary | ICD-10-CM | POA: Diagnosis present

## 2020-04-23 NOTE — Therapy (Addendum)
[facility] [address] [place], [place], [address] Phone: [phone]   Fax:  [phone]  Physical Therapy Evaluation and Discharge  Patient Details  Name: Erika Sherman MRN: [id number] Date of Birth: June 04, 1975 Referring Provider (PT): Eunice Blase, MD  PHYSICAL THERAPY DISCHARGE SUMMARY  Visits from Start of Care: 1  Current functional level related to goals / functional outcomes: See below   Remaining deficits: Only showed for initial eval; see below   Education / Equipment: See below   Patient agrees to discharge. Patient goals were not met. Patient is being discharged due to not returning since the last visit.   Encounter Date: 04/23/2020   PT End of Session - 04/23/20 1140     Visit Number 1    Number of Visits 13    Date for PT Re-Evaluation 06/04/20    Authorization Type [employer]    PT Start Time 1145    PT Stop Time 1230    PT Time Calculation (min) 45 min    Activity Tolerance Patient tolerated treatment well    Behavior During Therapy WFL for tasks assessed/performed             Past Medical History:  Diagnosis Date   GERD (gastroesophageal reflux disease)     Past Surgical History:  Procedure Laterality Date   CESAREAN SECTION     CHOLECYSTECTOMY      There were no vitals filed for this visit.    Subjective Assessment - 04/23/20 1145     Subjective Pt reports she fell in her house in the kitchen almost 2 years ago (Oct 2020). Pt states she fell in a squated position(felt muscles pop out of place) reports she didn't feel she broke anything. Pt says she can't stay seated for long. 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 Pt now able to pursue PT because she now has insurance coverage. 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    Pertinent History DM, THn, OA,morbid obesity    Limitations Sitting    How long can you sit comfortably? 30 minutes     How long can you stand comfortably? <5 min    How long can you walk comfortably? 38' with the walker    Diagnostic tests Needs approval for imaging    Patient Stated Goals Improve pain; return to PLOF    Currently in Pain? Yes    Pain Score 7     Pain Location Back    Pain Orientation Mid    Pain Descriptors / Indicators Aching;Constant;Radiating    Pain Type Chronic pain    Pain Radiating Towards L LE    Pain Onset More than a month ago    Pain Frequency Constant    Aggravating Factors  Standing, walking, sitting    Pain Relieving Factors Reports squirting mustard for cramping    Effect of Pain on Daily Activities Unable to work                [facility] PT Assessment - 04/23/20 0001       Assessment   Medical Diagnosis chronic low back pain    Referring Provider (PT) Eunice Blase, MD    Onset Date/Surgical Date --   Oct 2020   Prior Therapy None      Precautions   Precautions Back      Restrictions   Weight Bearing Restrictions No      Balance Screen  Has the patient fallen in the past 6 months No    Has the patient had a decrease in activity level because of a fear of falling?  Yes    Is the patient reluctant to leave their home because of a fear of falling?  Yes      [place] Private residence    Living Arrangements Other relatives   Sister, her son, cousin   Available Help at Discharge Family    Type of [place] Access Level entry    Home Layout One level    [place] - 2 wheels      Prior Function   Level of Independence Independent with basic ADLs;Independent with household mobility without device    Vocation Unemployed    Vocation Requirements Pt was a school bus driver      Observation/Other Assessments   Focus on Therapeutic Outcomes (FOTO)  n/a      Posture/Postural Control   Posture/Postural Control Postural limitations    Postural Limitations Forward head;Rounded Shoulders;Decreased lumbar  lordosis      ROM / Strength   AROM / PROM / Strength AROM      Strength   Overall Strength Comments Grossly 4/5 bilat hips, R knee WFL; L knee flex/ext unable to attempt MMT due to pain with palpation of LE    Strength Assessment Site Hip;Knee      Palpation   Palpation comment Pt guarding; would not allow pt to palpate L ankle/knee due to tenderness/pain      Special Tests    Special Tests Lumbar    Lumbar Tests FABER test;Straight Leg Raise      FABER test   findings Positive    Comment Bilat, L>R      Straight Leg Raise   Findings Positive    Comment bilat SLR up to 160 deg before pain      Bed Mobility   Bed Mobility Sit to Supine;Supine to Sit    Supine to Sit Contact Guard/Touching assist   Unable to tolerate full supine; pt maintains slight R rotation with head propped on multiple pillows   Sit to Supine Minimal Assistance - Patient > 75%      Transfers   Five time sit to stand comments  1 min 18 sec      Ambulation/Gait   Ambulation Distance (Feet) 50 Feet    Assistive device Rolling walker    Gait Pattern Step-through pattern;Decreased dorsiflexion - right;Decreased dorsiflexion - left;Right foot flat;Left foot flat;Shuffle;Trendelenburg;Trunk flexed;Wide base of support    Ambulation Surface Level;Indoor                        Objective measurements completed on examination: See above findings.                 PT Short Term Goals - 04/23/20 1257       PT SHORT TERM GOAL #1   Title Pt will be independent with initial HEP    Time 3    Period Weeks    Status New    Target Date 05/14/20      PT SHORT TERM GOAL #2   Title Pt will be able to tolerate standing for 10 minutes    Baseline Reports <5 minutes before needing to rest    Time 3    Period Weeks    Status New    Target Date  05/14/20      PT SHORT TERM GOAL #3   Title Pt will be able to walk at least 40' with LRAD    Baseline Unable to walk >25' without pain and  using RW    Time 3    Period Weeks    Status New    Target Date 05/14/20               PT Long Term Goals - 04/23/20 1300       PT LONG TERM GOAL #1   Title Pt will be independent with exercises to continue to improve her strength/stability    Time 6    Period Weeks    Status New    Target Date 06/04/20      PT LONG TERM GOAL #2   Title Pt will be able to tolerate standing at least 15 minutes    Baseline 5 min at baseline    Time 6    Period Weeks    Status New    Target Date 06/04/20      PT LONG TERM GOAL #3   Title Pt will be able to improve 5 x STS to </= 20 sec to demo improved functional strength    Baseline 1 min 19 sec    Time 6    Period Weeks    Status New    Target Date 06/04/20      PT LONG TERM GOAL #4   Title Pt will be able to amb at least 50' with LRAD    Time 6    Period Weeks    Status New    Target Date 06/04/20                    Plan - 04/23/20 1242     Clinical Impression Statement Pt is a 45 y/o F presenting to OPPT due to c/o low back pain since a fall in Oct of 2020. Assessment limited due to increased pt guarding from pain. Pt demos decreased hip ROM and lumbar flexion/extension due to pain, general LE weakness (pt guarding ankle DF/PF) with (+) L side sciatica. Pt guarding and tender to touch along lower L LE. Pt would benefit from PT to address her limited mobility, educate on pain science, and optimize her level of function for improved house hold tasks and potential return to work.    Personal Factors and Comorbidities Comorbidity 1;Time since onset of injury/illness/exacerbation;Comorbidity 2;Comorbidity 3+;Past/Current Experience;Transportation;Fitness;Behavior Pattern    Comorbidities DM, HTN, OA, morbid obesity    Examination-Activity Limitations Bed Mobility;Bend;Squat;Transfers;Stand;Toileting;Hygiene/Grooming;Dressing;Stairs;Carry;Sleep;Locomotion Level    Examination-Participation Restrictions  Cleaning;Occupation;Community Activity    Stability/Clinical Decision Making Evolving/Moderate complexity    Clinical Decision Making Moderate    Rehab Potential Fair    PT Frequency 2x / week   1x for land based therapy, 1x for aquatics   PT Duration 6 weeks    PT Treatment/Interventions ADLs/Self Care Home Management;Aquatic Therapy;Electrical Stimulation;Moist Heat;DME Instruction;Ultrasound;Iontophoresis 74m/ml Dexamethasone;Gait training;Stair training;Functional mobility training;Therapeutic activities;Therapeutic exercise;Balance training;Neuromuscular re-education;Manual techniques;Patient/family education;Passive range of motion;Dry needling;Taping    PT Next Visit Plan Assess response to initial HEP. Follow up on aquatics. Begin to educate on pain science as able. Continue to progress strengthening in seated position as able (unable to tolerate supine or sidelying)    PT Home Exercise Plan Access Code: [email or web address]   Consulted and Agree with Plan of Care Patient             Patient will  benefit from skilled therapeutic intervention in order to improve the following deficits and impairments:  Abnormal gait,Decreased range of motion,Difficulty walking,Increased fascial restricitons,Increased muscle spasms,Pain,Obesity,Decreased balance,Hypomobility,Impaired flexibility,Improper body mechanics,Decreased activity tolerance,Impaired perceived functional ability,Decreased mobility,Decreased strength,Postural dysfunction  Visit Diagnosis: Chronic midline low back pain with left-sided sciatica - Plan: PT plan of care cert/re-cert  Abnormal posture - Plan: PT plan of care cert/re-cert  Difficulty in walking, not elsewhere classified - Plan: PT plan of care cert/re-cert  Muscle weakness (generalized) - Plan: PT plan of care cert/re-cert     Problem List Patient Active Problem List   Diagnosis Date Noted   HTN (hypertension) 02/10/2017   DM (diabetes mellitus) ([place]) 02/10/2017    Smoking 02/10/2017   Family history of ovarian cancer 02/10/2017   Diabetic polyneuropathy ([place]) 12/07/2016   Candidiasis of skin 07/29/2016   Osteoarthritis 07/29/2016   Pain in left knee 07/29/2016   Vitamin D deficiency 07/29/2016   Elevated blood pressure 03/25/2015   GERD (gastroesophageal reflux disease) 03/25/2015   Itching 03/25/2015   Dysmenorrhea 06/09/2013   Morbid obesity ([place]) 12/29/2012    Artez Regis April Ma L Brentney Goldbach PT, DPT 04/23/2020, 1:08 PM  [place], [place], [address] Phone: [phone]   Fax:  [phone]  Name: Erika Sherman MRN: [id number] Date of Birth: 1975/09/06

## 2020-05-10 ENCOUNTER — Ambulatory Visit: Payer: BC Managed Care – PPO | Admitting: Podiatry

## 2022-09-14 ENCOUNTER — Ambulatory Visit: Payer: 59 | Admitting: Podiatry

## 2023-05-16 ENCOUNTER — Emergency Department (HOSPITAL_COMMUNITY)

## 2023-05-16 ENCOUNTER — Encounter (HOSPITAL_COMMUNITY): Payer: Self-pay

## 2023-05-16 ENCOUNTER — Other Ambulatory Visit: Payer: Self-pay

## 2023-05-16 ENCOUNTER — Emergency Department (HOSPITAL_COMMUNITY)
Admission: EM | Admit: 2023-05-16 | Discharge: 2023-05-16 | Disposition: A | Discharge status: Home / Self Care | Attending: Emergency Medicine | Admitting: Emergency Medicine

## 2023-05-16 DIAGNOSIS — Z8041 Family history of malignant neoplasm of ovary: Secondary | ICD-10-CM | POA: Insufficient documentation

## 2023-05-16 DIAGNOSIS — N9489 Other specified conditions associated with female genital organs and menstrual cycle: Secondary | ICD-10-CM | POA: Diagnosis not present

## 2023-05-16 DIAGNOSIS — N83202 Unspecified ovarian cyst, left side: Secondary | ICD-10-CM | POA: Diagnosis not present

## 2023-05-16 DIAGNOSIS — E119 Type 2 diabetes mellitus without complications: Secondary | ICD-10-CM | POA: Diagnosis not present

## 2023-05-16 DIAGNOSIS — Z87891 Personal history of nicotine dependence: Secondary | ICD-10-CM | POA: Diagnosis not present

## 2023-05-16 DIAGNOSIS — I10 Essential (primary) hypertension: Secondary | ICD-10-CM | POA: Insufficient documentation

## 2023-05-16 DIAGNOSIS — Z79899 Other long term (current) drug therapy: Secondary | ICD-10-CM | POA: Insufficient documentation

## 2023-05-16 DIAGNOSIS — R109 Unspecified abdominal pain: Secondary | ICD-10-CM

## 2023-05-16 DIAGNOSIS — Z7984 Long term (current) use of oral hypoglycemic drugs: Secondary | ICD-10-CM | POA: Diagnosis not present

## 2023-05-16 DIAGNOSIS — N946 Dysmenorrhea, unspecified: Secondary | ICD-10-CM | POA: Insufficient documentation

## 2023-05-16 DIAGNOSIS — D649 Anemia, unspecified: Secondary | ICD-10-CM | POA: Diagnosis not present

## 2023-05-16 DIAGNOSIS — N838 Other noninflammatory disorders of ovary, fallopian tube and broad ligament: Secondary | ICD-10-CM

## 2023-05-16 DIAGNOSIS — Z9049 Acquired absence of other specified parts of digestive tract: Secondary | ICD-10-CM | POA: Diagnosis not present

## 2023-05-16 HISTORY — DX: Diverticulosis of intestine, part unspecified, without perforation or abscess without bleeding: K57.90

## 2023-05-16 HISTORY — DX: Other noninflammatory disorders of ovary, fallopian tube and broad ligament: N83.8

## 2023-05-16 HISTORY — DX: Essential (primary) hypertension: I10

## 2023-05-16 HISTORY — DX: Type 2 diabetes mellitus without complications: E11.9

## 2023-05-16 LAB — CBC
HCT: 35.7 % — ABNORMAL LOW (ref 36.0–46.0)
Hemoglobin: 11 g/dL — ABNORMAL LOW (ref 12.0–15.0)
MCH: 28.2 pg (ref 26.0–34.0)
MCHC: 30.8 g/dL (ref 30.0–36.0)
MCV: 91.5 fL (ref 80.0–100.0)
Platelets: 253 10*3/uL (ref 150–400)
RBC: 3.9 MIL/uL (ref 3.87–5.11)
RDW: 14.7 % (ref 11.5–15.5)
WBC: 6.6 10*3/uL (ref 4.0–10.5)
nRBC: 0 % (ref 0.0–0.2)

## 2023-05-16 LAB — URINALYSIS, ROUTINE W REFLEX MICROSCOPIC
Bilirubin Urine: NEGATIVE
Glucose, UA: NEGATIVE mg/dL
Hgb urine dipstick: NEGATIVE
Ketones, ur: NEGATIVE mg/dL
Leukocytes,Ua: NEGATIVE
Nitrite: NEGATIVE
Protein, ur: NEGATIVE mg/dL
Specific Gravity, Urine: 1.023 (ref 1.005–1.030)
pH: 5 (ref 5.0–8.0)

## 2023-05-16 LAB — BASIC METABOLIC PANEL
Anion gap: 9 (ref 5–15)
BUN: 8 mg/dL (ref 6–20)
CO2: 24 mmol/L (ref 22–32)
Calcium: 9 mg/dL (ref 8.9–10.3)
Chloride: 109 mmol/L (ref 98–111)
Creatinine, Ser: 0.79 mg/dL (ref 0.44–1.00)
GFR, Estimated: >60 mL/min (ref >60)
Glucose, Bld: 105 mg/dL — ABNORMAL HIGH (ref 70–99)
Potassium: 3.7 mmol/L (ref 3.5–5.1)
Sodium: 142 mmol/L (ref 135–145)

## 2023-05-16 LAB — HCG, SERUM, QUALITATIVE: Preg, Serum: NEGATIVE

## 2023-05-16 MED ORDER — MORPHINE SULFATE (PF) 4 MG/ML IV SOLN
4.0000 mg | Freq: Once | INTRAVENOUS | Status: AC
  Administered 2023-05-16: 4 mg via INTRAVENOUS
  Filled 2023-05-16: qty 1

## 2023-05-16 MED ORDER — ONDANSETRON HCL 4 MG/2ML IJ SOLN
4.0000 mg | Freq: Once | INTRAMUSCULAR | Status: AC
  Administered 2023-05-16: 4 mg via INTRAVENOUS
  Filled 2023-05-16: qty 2

## 2023-05-16 MED ORDER — LISINOPRIL 20 MG PO TABS
20.0000 mg | ORAL_TABLET | Freq: Once | ORAL | Status: AC
  Administered 2023-05-16: 20 mg via ORAL
  Filled 2023-05-16: qty 1

## 2023-05-16 MED ORDER — LISINOPRIL 10 MG PO TABS: 10.0000 mg | ORAL_TABLET | Freq: Once | ORAL | Status: DC

## 2023-05-16 MED ORDER — OXYCODONE HCL 5 MG PO TABS: 5.0000 mg | ORAL_TABLET | ORAL | 0 refills | Status: AC | PRN

## 2023-05-16 NOTE — Discharge Instructions (Addendum)
 Thank you for coming to [facility] Emergency Department. You were seen for left flank and left abdominal. We did an exam, labs, and CT imaging, and these showed a mass on the left ovary.  Ultrasound imaging did not visualize the ovaries.  We consulted to Dr. Debroah Loop with OB/GYN who recommended NSAIDs for pain control, oxycodone for breakthrough pain, and follow-up with OB/GYN in clinic.  [facility]  You can alternate taking Tylenol and ibuprofen as needed for pain. You can take 650mg  tylenol (acetaminophen) every 4-6 hours, and 600 mg ibuprofen 3 times a day. You can take oxycodone 5 mg every 4-6 hours for breakthrough pain.  Do not hesitate to return to the ED or call 911 if you experience: -Worsening symptoms -Nausea/vomiting so severe you cannot eat/drink -Profuse vaginal bleeding saturating >2 pads per hour for >2 hours -Severe abdominal pain you cannot manage at home -Lightheadedness, passing out -Fevers/chills -Anything else that concerns you

## 2023-05-16 NOTE — ED Provider Notes (Signed)
 [place] EMERGENCY DEPARTMENT AT [facility] Provider Note   CSN: [id number] Arrival date & time: 05/16/23  1032     History  Chief Complaint  Patient presents with   Flank Pain    Erika Sherman is a 48 y.o. female with PMH as listed below who presents with flank pain.  Patient reports intermittent left-sided flank pain that radiates into LLQ for the past week that has become persistent. Worse when she lies down. Excruciating, stabbing pain, currently rated 8/10. Denies any associated fevers, nausea, vomiting.  No dysuria/hematuria, constipation/diarrhea, abnormal vaginal discharge/itching/burning. Does have known h/o irregular menstrual bleeding, LMP 04/19/23. Is aware of a left ovarian mass found by ultrasound several years ago after she was having LLQ pain. Denies any aggravating incidents prior to onset of symptoms. Has h/o prior cholecystectomy and C/S.   Past Medical History:  Diagnosis Date   Diabetes mellitus without complication (HCC)    Diverticulosis    GERD (gastroesophageal reflux disease)    Hypertension    Mass of left ovary        Home Medications Prior to Admission medications   Medication Sig Start Date End Date Taking? Authorizing Provider  oxyCODONE (ROXICODONE) 5 MG immediate release tablet Take 1 tablet (5 mg total) by mouth every 4 (four) hours as needed for up to 5 days for severe pain (pain score 7-10). 05/16/23 05/21/23 Yes Loetta Rough, MD  acetaminophen (TYLENOL) 500 MG tablet Take 1,000 mg by mouth every 6 (six) hours as needed for mild pain.    [provider]  albuterol (PROVENTIL HFA;VENTOLIN HFA) 108 (90 BASE) MCG/ACT inhaler Inhale 2 puffs into the lungs every 2 (two) hours as needed for wheezing or shortness of breath (cough). 05/10/13   Cristobal Goldmann, PA-C  azithromycin (ZITHROMAX) 250 MG tablet Take by mouth. 11/08/19   [provider]  baclofen (LIORESAL) 10 MG tablet Take 0.5-1 tablets (5-10 mg total) by mouth 3  (three) times daily as needed for muscle spasms. 11/17/19   Hilts, Casimiro Needle, MD  cyclobenzaprine (FLEXERIL) 5 MG tablet Take 5 mg by mouth 3 (three) times daily as needed. 10/12/19   [provider]  doxycycline (ADOXA) 100 MG tablet Take 100 mg by mouth 2 (two) times daily. 10/30/19   [provider]  gabapentin (NEURONTIN) 800 MG tablet Take 800 mg by mouth 3 (three) times daily. 10/09/19   [provider]  ibuprofen (ADVIL,MOTRIN) 800 MG tablet Take 1 tablet (800 mg total) by mouth every 8 (eight) hours as needed. Patient not taking: Reported on 04/17/2017 04/04/14   Charlestine Night, PA-C  lisinopril (PRINIVIL,ZESTRIL) 10 MG tablet Take 10 mg by mouth daily.    [provider]  metFORMIN (GLUCOPHAGE-XR) 750 MG 24 hr tablet Take 750 mg by mouth daily.    [provider]  omeprazole (PRILOSEC) 20 MG capsule Take 20 mg by mouth daily.    [provider]  ondansetron (ZOFRAN ODT) 4 MG disintegrating tablet Take 1 tablet (4 mg total) by mouth every 8 (eight) hours as needed for nausea or vomiting. 04/17/17   Khatri, Hina, PA-C  predniSONE (STERAPRED UNI-PAK 21 TAB) 10 MG (21) TBPK tablet Take by mouth. 10/30/19   [provider]      Allergies    Lactose intolerance (gi)    Review of Systems   Review of Systems A 10 point review of systems was performed and is negative unless otherwise reported in HPI.  Physical Exam Updated Vital Signs  BP (!) 149/97 (BP Location: Right Arm)   Pulse 69   Temp 98 F (36.7 C) (Oral)   Resp 20   Ht 5\' 10"  (1.778 m)   Wt (!) 172.4 kg   LMP 04/18/2023   SpO2 99%   BMI 54.52 kg/m  Physical Exam General: Normal appearing female, lying in bed.  HEENT: Sclera anicteric, MMM, trachea midline.  Cardiology: RRR, no murmurs/rubs/gallops.  Resp: Normal respiratory rate and effort. CTAB, no wheezes, rhonchi, crackles.  Abd: Soft, +LLQ TTP, non-distended. No rebound tenderness or guarding.  GU:  Deferred. MSK: No peripheral edema or signs of trauma.  Skin: warm, dry.  Back: +L CVA TTP Neuro: A&Ox4, CNs II-XII grossly intact. MAEs.  Psych: Normal mood and affect.   ED Results / Procedures / Treatments   Labs (all labs ordered are listed, but only abnormal results are displayed) Labs Reviewed  URINALYSIS, ROUTINE W REFLEX MICROSCOPIC - Abnormal; Notable for the following components:      Result Value   APPearance HAZY (*)    All other components within normal limits  BASIC METABOLIC PANEL - Abnormal; Notable for the following components:   Glucose, Bld 105 (*)    All other components within normal limits  CBC - Abnormal; Notable for the following components:   Hemoglobin 11.0 (*)    HCT 35.7 (*)    All other components within normal limits  HCG, SERUM, QUALITATIVE  CA 125    EKG None  Radiology US PELVIC COMPLETE W TRANSVAGINAL AND TORSION R/O Result Date: 05/16/2023 CLINICAL DATA:  Left lower quadrant abdominal pain. EXAM: TRANSABDOMINAL AND TRANSVAGINAL ULTRASOUND OF PELVIS TECHNIQUE: Both transabdominal and transvaginal ultrasound examinations of the pelvis were performed. Transabdominal technique was performed for global imaging of the pelvis including uterus, ovaries, adnexal regions, and pelvic cul-de-sac. It was necessary to proceed with endovaginal exam following the transabdominal exam to visualize the endometrium and ovaries. COMPARISON:  CT scan of same day.  Ultrasound of November 26, 2010. FINDINGS: Uterus Measurements: 10.8 x 6.4 x 5.3 cm = volume: 189 mL. No fibroids or other mass visualized. Endometrium Thickness: 21 mm which is abnormally thickened. No focal abnormality visualized. Right ovary Not visualized, potentially due to body habitus. Left ovary Not visualized, potentially due to body habitus. Other findings No abnormal free fluid. IMPRESSION: Ovaries are not visualized, potentially due to imaging limitations secondary to body habitus. Endometrium is  abnormally thickened at 21 mm. Endometrial thickness is considered abnormal. Consider follow-up by [place] in 6-8 weeks, during the week immediately following menses (exam timing is critical). Electronically Signed   By: Lupita Raider M.D.   On: 05/16/2023 16:20   CT Renal Stone Study Result Date: 05/16/2023 CLINICAL DATA:  Abdominal/flank pain, stone suspected EXAM: CT ABDOMEN AND PELVIS WITHOUT CONTRAST TECHNIQUE: Multidetector CT imaging of the abdomen and pelvis was performed following the standard protocol without IV contrast. RADIATION DOSE REDUCTION: This exam was performed according to the departmental dose-optimization program which includes automated exposure control, adjustment of the mA and/or kV according to patient size and/or use of iterative reconstruction technique. COMPARISON:  October 24, 2018 FINDINGS: Evaluation is limited by lack of IV contrast and body habitus. Lower chest: No acute abnormality. Hepatobiliary: Status post cholecystectomy. No focal hepatic abnormality. Hepatomegaly. Pancreas: No peripancreatic fat stranding. Spleen: Unremarkable. Adrenals/Urinary Tract: Adrenal glands are unremarkable. No hydronephrosis. No obstructing nephrolithiasis. Bladder is unremarkable. Stomach/Bowel: No evidence of bowel obstruction. No ancillary evidence of acute appendicitis. Appendix candidate is normal. Stomach  is decompressed and unremarkable. Vascular/Lymphatic: Abdominal aorta normal in course and caliber. Mild atherosclerotic calcifications of the common iliac artery. Shotty retroperitoneal lymph nodes without adenopathy by size criteria, similar in comparison to prior. Reproductive: Endometrium is prominent measuring approximately 15 mm in thickness. There is a LEFT adnexal mildly complicated appearing cyst versus 2 adjacent cysts which spans approximately 7.0 by 3.8 cm Other: No free air or free fluid. Musculoskeletal: No acute or significant osseous findings. Degenerative changes of the hips.  IMPRESSION: 1. No obstructing nephrolithiasis. No hydronephrosis. 2. There is a LEFT adnexal mildly complicated appearing cyst versus 2 adjacent cysts which spans approximately 7.0 by 3.8 cm. Given report of LEFT-sided pain, recommend further evaluation with dedicated pelvic ultrasound. 3. Endometrium is prominent measuring approximately 15 mm in thickness. Recommend correlation with menstrual cycle and attention on pelvic ultrasound. Electronically Signed   By: Meda Klinefelter M.D.   On: 05/16/2023 13:21    Procedures Procedures    Medications Ordered in ED Medications  morphine (PF) 4 MG/ML injection 4 mg (4 mg Intravenous Given 05/16/23 1614)  ondansetron (ZOFRAN) injection 4 mg (4 mg Intravenous Given 05/16/23 1614)    ED Course/ Medical Decision Making/ A&P                          Medical Decision Making Amount and/or Complexity of Data Reviewed Labs: ordered. Decision-making details documented in ED Course. Radiology:  Decision-making details documented in ED Course.  Risk Prescription drug management.    This patient presents to the ED for concern of intermittent left flank pain, this involves an extensive number of treatment options, and is a complaint that carries with it a high risk of complications and morbidity.  I considered the following differential and admission for this acute, potentially life threatening condition. Patient is hypertensive on arrival but otherwise HDS, afebrile.  MDM:    DDX for flank pain includes but is not limited to:  -Doubt vascular causes such as abdominal aortic aneurysm or dissection, renal artery embolism. -Considered GU causes such as pyelonephritis, nephrolithiasis/ureterolithiasis. She reports no urinary sxs or f/c.  -Considered GI causes such as peptic ulcer, diverticulitis. No SOB or CP to indicate respiratory causes such as lower lobe PNA, PE, or pleurisy. -Considered Gynecologic/GU causes such as ectopic pregnancy, PID/TOA, ovarian  cyst vs torsion, endometriosis. Hcg is negative, but patient is known to have history of ovarian mass on the left, raising suspicion for ovarian torsion or hemorrhagic cyst.    Clinical Course as of 05/16/23 Everrett Coombe May 16, 2023  1514 Urinalysis, Routine w reflex microscopic -Urine, Clean Catch(!) Neg for UTI or blood [HN]  1514 Preg, Serum: NEGATIVE neg [HN]  1514 Basic metabolic panel(!) Unremarkable in the context of this patient's presentation  [HN]  1514 Hemoglobin(!): 11.0 Mild anemia, otherwise unremarkable [HN]  1515 CT Renal Stone Study 1. No obstructing nephrolithiasis. No hydronephrosis. 2. There is a LEFT adnexal mildly complicated appearing cyst versus 2 adjacent cysts which spans approximately 7.0 by 3.8 cm. Given report of LEFT-sided pain, recommend further evaluation with dedicated pelvic ultrasound. 3. Endometrium is prominent measuring approximately 15 mm in thickness. Recommend correlation with menstrual cycle and attention on pelvic ultrasound.   [HN]  1625 US PELVIC COMPLETE W TRANSVAGINAL AND TORSION R/O Ovaries are not visualized, potentially due to imaging limitations secondary to body habitus.  Endometrium is abnormally thickened at 21 mm. Endometrial thickness is considered abnormal. Consider follow-up by [place] in  6-8 weeks, during the week immediately following menses (exam timing is critical).   [HN]  1625 Consulted to OB/GYN and discussed with Dr. Debroah Loop, who will review the case [HN]  1810 Messaged OB/Gyn [HN]  1830 Per Dr. Debroah Loop, patient is ok to go home on NSAID and oral oxycodone for breakthrough pain and [facility].  He states that she does have a family history of ovarian cancer and he has ordered a CA125 as well which is in process.  He states that her exam and lack of leukocytosis do not indicate ovarian torsion.  He will be referring her to a provider who performs robotic surgery.  Patient is  recommended Tylenol and ibuprofen for pain control with breakthrough oxycodone.  She will [facility] with [facility]  Given discharge instructions and return precautions, all questions answered to patient satisfaction. [HN]    Clinical Course User Index [HN] Loetta Rough, MD    Labs: I Ordered, and personally interpreted labs.  The pertinent results include:  those listed above  Imaging Studies ordered: CT renal stone study, pelvic US ordered from triage I independently visualized and interpreted imaging. I agree with the radiologist interpretation  Additional history obtained from chart review.  External records from outside source obtained and reviewed including 2021  Reevaluation: After the interventions noted above, I reevaluated the patient and found that they have :improved  Social Determinants of Health: Lives independently  Disposition:  DC  Co morbidities that complicate the patient evaluation  Past Medical History:  Diagnosis Date   Diabetes mellitus without complication (HCC)    Diverticulosis    GERD (gastroesophageal reflux disease)    Hypertension    Mass of left ovary      Medicines Meds ordered this encounter  Medications   morphine (PF) 4 MG/ML injection 4 mg   ondansetron (ZOFRAN) injection 4 mg   oxyCODONE (ROXICODONE) 5 MG immediate release tablet    Sig: Take 1 tablet (5 mg total) by mouth every 4 (four) hours as needed for up to 5 days for severe pain (pain score 7-10).    Dispense:  18 tablet    Refill:  0    I have reviewed the patients home medicines and have made adjustments as needed  Problem List / ED Course: Problem List Items Addressed This Visit   None Visit Diagnoses       Left flank pain    -  Primary     Ovarian mass, left                       This note was created using dictation software, which may contain spelling or grammatical errors.    Loetta Rough, MD 05/16/23 [phone]

## 2023-05-16 NOTE — ED Notes (Signed)
 Discharge pending prescription per MD for hypertension

## 2023-05-16 NOTE — ED Notes (Signed)
 Patient transported to Ultrasound

## 2023-05-16 NOTE — Consult Note (Signed)
 Reason for Consult:Left pelvic pain, pelvic mass Referring Physician: Dr. Quintella Reichert is an 48 y.o. female. G1P1001 She came to ED after 4 days of left side pelvic pain which did not improve with OTC pain medication.   Excruciating, stabbing pain, rated 8/10 now rated 7 Denies any associated fevers, nausea, vomiting.  No dysuria/hematuria, constipation/diarrhea, abnormal vaginal discharge/itching/burning. Does have known h/o irregular menstrual bleeding, LMP 04/19/23. Is aware of a left ovarian mass found by ultrasound several years ago after she was having LLQ pain  Pertinent Gynecological History: Menses: flow is moderate and with severe dysmenorrhea Contraception: none Blood transfusions: none Sexually transmitted diseases: no past history  Last mammogram:  fibrocystic changes  Date: 10/2022 Last pap: normal Date: 2018 OB History: G1, P1   Menstrual History:  Patient's last menstrual period was 04/18/2023.    Past Medical History:  Diagnosis Date   Diabetes mellitus without complication (HCC)    Diverticulosis    GERD (gastroesophageal reflux disease)    Hypertension    Mass of left ovary     Past Surgical History:  Procedure Laterality Date   CESAREAN SECTION     CHOLECYSTECTOMY      Family History  Problem Relation Age of Onset   Ovarian cancer Mother     Social History:  reports that she has quit smoking. Her smoking use included cigarettes. She has never used smokeless tobacco. She reports current alcohol use. She reports current drug use. Drug: Marijuana.  Allergies:  Allergies  Allergen Reactions   Lactose Intolerance (Gi) Nausea And Vomiting    Upset stomach    Medications: I have reviewed the patient's current medications.  Review of Systems  Constitutional: Negative.   Respiratory: Negative.    Cardiovascular: Negative.   Gastrointestinal:  Positive for abdominal pain. Negative for constipation, diarrhea, nausea and vomiting.   Genitourinary:  Positive for pelvic pain. Negative for vaginal bleeding and vaginal discharge.    Blood pressure (!) 140/83, pulse (!) 58, temperature 98 F (36.7 C), temperature source Oral, resp. rate 20, height 5\' 10"  (1.778 m), weight (!) 172.4 kg, last menstrual period 04/18/2023, SpO2 97%. Physical Exam Vitals and nursing note reviewed.  Constitutional:      Appearance: She is obese. She is not ill-appearing.  HENT:     Head: Normocephalic and atraumatic.  Cardiovascular:     Rate and Rhythm: Normal rate.  Pulmonary:     Effort: Pulmonary effort is normal.  Abdominal:     Palpations: Abdomen is soft. There is no mass.     Tenderness: Tenderness: very minimal tenderness. There is no guarding or rebound.  Neurological:     General: No focal deficit present.     Mental Status: She is alert.  Psychiatric:        Mood and Affect: Mood normal.        Behavior: Behavior normal.     Results for orders placed or performed during the hospital encounter of 05/16/23 (from the past 48 hours)  hCG, serum, qualitative     Status: None   Collection Time: 05/16/23 11:20 AM  Result Value Ref Range   Preg, Serum NEGATIVE NEGATIVE    Comment:        THE SENSITIVITY OF THIS METHODOLOGY IS >10 mIU/mL. Performed at [facility], [address] [place], [place] [address]   Basic metabolic panel     Status: Abnormal   Collection Time: 05/16/23 11:20 AM  Result Value Ref Range  Sodium 142 135 - 145 mmol/L   Potassium 3.7 3.5 - 5.1 mmol/L   Chloride 109 98 - 111 mmol/L   CO2 24 22 - 32 mmol/L   Glucose, Bld 105 (H) 70 - 99 mg/dL    Comment: Glucose reference range applies only to samples taken after fasting for at least 8 hours.   BUN 8 6 - 20 mg/dL   Creatinine, Ser 7.82 0.44 - 1.00 mg/dL   Calcium 9.0 8.9 - 95.6 mg/dL   GFR, Estimated >21 >30 mL/min    Comment: (NOTE) Calculated using the CKD-EPI Creatinine Equation (2021)    Anion gap 9 5 - 15    Comment: Performed at  [facility], [address] [place], [place] [address]  CBC     Status: Abnormal   Collection Time: 05/16/23 11:20 AM  Result Value Ref Range   WBC 6.6 4.0 - 10.5 K/uL   RBC 3.90 3.87 - 5.11 MIL/uL   Hemoglobin 11.0 (L) 12.0 - 15.0 g/dL   HCT 46.9 (L) 62.9 - 52.8 %   MCV 91.5 80.0 - 100.0 fL   MCH 28.2 26.0 - 34.0 pg   MCHC 30.8 30.0 - 36.0 g/dL   RDW 41.3 24.4 - 01.0 %   Platelets 253 150 - 400 K/uL   nRBC 0.0 0.0 - 0.2 %    Comment: Performed at [facility], [address] [place], [place] [address]  Urinalysis, Routine w reflex microscopic -Urine, Clean Catch     Status: Abnormal   Collection Time: 05/16/23 11:33 AM  Result Value Ref Range   Color, Urine YELLOW YELLOW   APPearance HAZY (A) CLEAR   Specific Gravity, Urine 1.023 1.005 - 1.030   pH 5.0 5.0 - 8.0   Glucose, UA NEGATIVE NEGATIVE mg/dL   Hgb urine dipstick NEGATIVE NEGATIVE   Bilirubin Urine NEGATIVE NEGATIVE   Ketones, ur NEGATIVE NEGATIVE mg/dL   Protein, ur NEGATIVE NEGATIVE mg/dL   Nitrite NEGATIVE NEGATIVE   Leukocytes,Ua NEGATIVE NEGATIVE    Comment: Performed at [facility], [address], [place], [place] [address]    US PELVIC COMPLETE W TRANSVAGINAL AND TORSION R/O Result Date: 05/16/2023 CLINICAL DATA:  Left lower quadrant abdominal pain. EXAM: TRANSABDOMINAL AND TRANSVAGINAL ULTRASOUND OF PELVIS TECHNIQUE: Both transabdominal and transvaginal ultrasound examinations of the pelvis were performed. Transabdominal technique was performed for global imaging of the pelvis including uterus, ovaries, adnexal regions, and pelvic cul-de-sac. It was necessary to proceed with endovaginal exam following the transabdominal exam to visualize the endometrium and ovaries. COMPARISON:  CT scan of same day.  Ultrasound of November 26, 2010. FINDINGS: Uterus Measurements: 10.8 x 6.4 x 5.3 cm = volume: 189 mL. No fibroids or other mass visualized. Endometrium Thickness: 21 mm which is abnormally  thickened. No focal abnormality visualized. Right ovary Not visualized, potentially due to body habitus. Left ovary Not visualized, potentially due to body habitus. Other findings No abnormal free fluid. IMPRESSION: Ovaries are not visualized, potentially due to imaging limitations secondary to body habitus. Endometrium is abnormally thickened at 21 mm. Endometrial thickness is considered abnormal. Consider follow-up by [place] in 6-8 weeks, during the week immediately following menses (exam timing is critical). Electronically Signed   By: Lupita Raider M.D.   On: 05/16/2023 16:20   CT Renal Stone Study Result Date: 05/16/2023 CLINICAL DATA:  Abdominal/flank pain, stone suspected EXAM: CT ABDOMEN AND PELVIS WITHOUT CONTRAST TECHNIQUE: Multidetector CT imaging of the abdomen and  pelvis was performed following the standard protocol without IV contrast. RADIATION DOSE REDUCTION: This exam was performed according to the departmental dose-optimization program which includes automated exposure control, adjustment of the mA and/or kV according to patient size and/or use of iterative reconstruction technique. COMPARISON:  October 24, 2018 FINDINGS: Evaluation is limited by lack of IV contrast and body habitus. Lower chest: No acute abnormality. Hepatobiliary: Status post cholecystectomy. No focal hepatic abnormality. Hepatomegaly. Pancreas: No peripancreatic fat stranding. Spleen: Unremarkable. Adrenals/Urinary Tract: Adrenal glands are unremarkable. No hydronephrosis. No obstructing nephrolithiasis. Bladder is unremarkable. Stomach/Bowel: No evidence of bowel obstruction. No ancillary evidence of acute appendicitis. Appendix candidate is normal. Stomach is decompressed and unremarkable. Vascular/Lymphatic: Abdominal aorta normal in course and caliber. Mild atherosclerotic calcifications of the common iliac artery. Shotty retroperitoneal lymph nodes without adenopathy by size criteria, similar in comparison to prior.  Reproductive: Endometrium is prominent measuring approximately 15 mm in thickness. There is a LEFT adnexal mildly complicated appearing cyst versus 2 adjacent cysts which spans approximately 7.0 by 3.8 cm Other: No free air or free fluid. Musculoskeletal: No acute or significant osseous findings. Degenerative changes of the hips. IMPRESSION: 1. No obstructing nephrolithiasis. No hydronephrosis. 2. There is a LEFT adnexal mildly complicated appearing cyst versus 2 adjacent cysts which spans approximately 7.0 by 3.8 cm. Given report of LEFT-sided pain, recommend further evaluation with dedicated pelvic ultrasound. 3. Endometrium is prominent measuring approximately 15 mm in thickness. Recommend correlation with menstrual cycle and attention on pelvic ultrasound. Electronically Signed   By: Meda Klinefelter M.D.   On: 05/16/2023 13:21    Assessment/Plan: Left pelvic pain with left pelvic cystic mass 7x4 cm which had been seen on previous imaging but she never saw a Gyn. Her PCP made a referral but she has no appointment. Her exam is benign and labs and presentation not suspicious for adnexal torsion. She may f/u at OP at [facility] and and I requested she see Dr. Odie Sera as robotic surgery may be indicated due to body habitus. Recommend NSAID and oral oxycodone for pain . Message was sent to [facility] 05/16/2023

## 2023-05-16 NOTE — ED Provider Triage Note (Signed)
 Emergency Medicine Provider Triage Evaluation Note  Erika Sherman , a 47 y.o. female  was evaluated in triage.  Pt complains of flank pain.  Patient reports intermittent left-sided flank pain for the past week that has not become persistent.  Pain radiates from the back to the front.  Denies any associated fevers, nausea, vomiting, diarrhea.  No changes to urination or bowel habits.  Denies any aggravating incidents prior to onset of symptoms.  Review of Systems  Positive: Left flank pain Negative: Fevers, nausea, vomiting  Physical Exam  BP (!) 168/93 (BP Location: Right Arm)   Pulse 68   Temp 98.4 F (36.9 C)   Resp 20   Ht 5\' 10"  (1.778 m)   Wt (!) 172.4 kg   LMP 04/18/2023   SpO2 95%   BMI 54.52 kg/m  Gen:   Awake, no distress   Resp:  Normal effort  MSK:   Moves extremities without difficulty  Other:    Medical Decision Making  Medically screening exam initiated at 11:35 AM.  Appropriate orders placed.  Erika Sherman was informed that the remainder of the evaluation will be completed by another provider, this initial triage assessment does not replace that evaluation, and the importance of remaining in the ED until their evaluation is complete.   Maxwell Marion, PA-C 05/16/23 1136

## 2023-05-16 NOTE — ED Triage Notes (Signed)
 Pt presents with a 3-4 day hx of sharp L flank pain that started as intermittent and became a constant pain yesterday. Certain movements make the pain worse. Pt denies any recent injury. Pt denies N/V/D or urinary symptoms. Pt saw a PCP twice this week to establish care and for chronic conditions and was told she had an "abnormal EKG." She was not evaluated for this complaint.

## 2023-05-17 ENCOUNTER — Telehealth: Payer: Self-pay | Admitting: *Deleted

## 2023-05-17 NOTE — Telephone Encounter (Signed)
.  Pharmacy called related to Rx being sent to an alternate pharmacy.  RNCM left message for EDP to reroute to pharmacy of choice [facility]).

## 2023-05-18 LAB — CA 125: Cancer Antigen (CA) 125: 4.1 U/mL (ref 0.0–38.1)

## 2023-06-02 ENCOUNTER — Encounter: Admitting: Obstetrics & Gynecology

## 2023-06-08 NOTE — Progress Notes (Unsigned)
 NEW GYNECOLOGY PATIENT Patient name: Erika Sherman MRN [id number]  Date of birth: 12/06/1975 Chief Complaint:   No chief complaint on file.     History:  Erika Sherman is a 48 y.o. G1P1001 being seen today for ***.    Seen in ED 3/9: complaint of left flank pain. Presented with sharp stabbing pain and disclosed history of prior left ovarian mass/cyst several years ago. Single large vs 2 small cysts      Gynecologic History Patient's last menstrual period was 04/18/2023. Contraception: {method:5051} Last Pap: ***. Result was {norm/abn:16337} with negative HPV Last Mammogram: {NA AND OZHYQMVH:84696} Last Colonoscopy: {NA AND EXBMWUXL:24401}  Obstetric History OB History  Gravida Para Term Preterm AB Living  1 1 1   1   SAB IAB Ectopic Multiple Live Births      1    # Outcome Date GA Lbr Len/2nd Weight Sex Type Anes PTL Lv  1 Term 1998     CS-Unspec   LIV    Past Medical History:  Diagnosis Date   Diabetes mellitus without complication (HCC)    Diverticulosis    GERD (gastroesophageal reflux disease)    Hypertension    Mass of left ovary     Past Surgical History:  Procedure Laterality Date   CESAREAN SECTION     CHOLECYSTECTOMY      Current Outpatient Medications on File Prior to Visit  Medication Sig Dispense Refill   acetaminophen (TYLENOL) 500 MG tablet Take 1,000 mg by mouth every 6 (six) hours as needed for mild pain.     albuterol (PROVENTIL HFA;VENTOLIN HFA) 108 (90 BASE) MCG/ACT inhaler Inhale 2 puffs into the lungs every 2 (two) hours as needed for wheezing or shortness of breath (cough). 1 Inhaler 0   azithromycin (ZITHROMAX) 250 MG tablet Take by mouth.     baclofen (LIORESAL) 10 MG tablet Take 0.5-1 tablets (5-10 mg total) by mouth 3 (three) times daily as needed for muscle spasms. 30 each 3   cyclobenzaprine (FLEXERIL) 5 MG tablet Take 5 mg by mouth 3 (three) times daily as needed.     doxycycline (ADOXA) 100 MG tablet Take 100 mg by mouth 2 (two)  times daily.     gabapentin (NEURONTIN) 800 MG tablet Take 800 mg by mouth 3 (three) times daily.     ibuprofen (ADVIL,MOTRIN) 800 MG tablet Take 1 tablet (800 mg total) by mouth every 8 (eight) hours as needed. (Patient not taking: Reported on 04/17/2017) 30 tablet 0   lisinopril (PRINIVIL,ZESTRIL) 10 MG tablet Take 10 mg by mouth daily.     metFORMIN (GLUCOPHAGE-XR) 750 MG 24 hr tablet Take 750 mg by mouth daily.     omeprazole (PRILOSEC) 20 MG capsule Take 20 mg by mouth daily.     ondansetron (ZOFRAN ODT) 4 MG disintegrating tablet Take 1 tablet (4 mg total) by mouth every 8 (eight) hours as needed for nausea or vomiting. 20 tablet 0   predniSONE (STERAPRED UNI-PAK 21 TAB) 10 MG (21) TBPK tablet Take by mouth.     No current facility-administered medications on file prior to visit.    Allergies  Allergen Reactions   Lactose Intolerance (Gi) Nausea And Vomiting    Upset stomach    Social History:  reports that she has quit smoking. Her smoking use included cigarettes. She has never used smokeless tobacco. She reports current alcohol use. She reports current drug use. Drug: Marijuana.  Family History  Problem Relation Age of Onset   Ovarian cancer  Mother     The following portions of the patient's history were reviewed and updated as appropriate: allergies, current medications, past family history, past medical history, past social history, past surgical history and problem list.  Review of Systems Pertinent items noted in HPI and remainder of comprehensive ROS otherwise negative.  Physical Exam:  LMP 04/18/2023  Physical Exam     Assessment and Plan:   There are no diagnoses linked to this encounter.   Routine preventative health maintenance measures emphasized. Please refer to After Visit Summary for other counseling recommendations.   Follow-up: No follow-ups on file.      Lorriane Shire, MD Obstetrician & Gynecologist, [facility]  [facility] [employer], [facility]

## 2023-06-09 ENCOUNTER — Ambulatory Visit (INDEPENDENT_AMBULATORY_CARE_PROVIDER_SITE_OTHER): Admitting: Obstetrics and Gynecology

## 2023-06-09 ENCOUNTER — Encounter: Payer: Self-pay | Admitting: Obstetrics and Gynecology

## 2023-06-09 VITALS — BP 141/82 | HR 84 | Ht 67.0 in | Wt 368.0 lb

## 2023-06-09 DIAGNOSIS — N9489 Other specified conditions associated with female genital organs and menstrual cycle: Secondary | ICD-10-CM

## 2023-06-09 DIAGNOSIS — E278 Other specified disorders of adrenal gland: Secondary | ICD-10-CM

## 2023-07-21 ENCOUNTER — Ambulatory Visit
Admission: RE | Admit: 2023-07-21 | Discharge: 2023-07-21 | Disposition: A | Discharge status: Home / Self Care | Source: Ambulatory Visit | Attending: Obstetrics and Gynecology

## 2023-07-21 DIAGNOSIS — E278 Other specified disorders of adrenal gland: Secondary | ICD-10-CM

## 2023-07-21 MED ORDER — GADOPICLENOL 0.5 MMOL/ML IV SOLN
10.0000 mL | Freq: Once | INTRAVENOUS | Status: AC | PRN
Start: 2023-07-21 — End: 2023-07-21
  Administered 2023-07-21: 10 mL via INTRAVENOUS

## 2023-07-22 ENCOUNTER — Ambulatory Visit: Payer: Self-pay | Admitting: Obstetrics and Gynecology

## 2023-08-25 ENCOUNTER — Ambulatory Visit: Admitting: Obstetrics and Gynecology
